# Patient Record
Sex: Male | Born: 1955 | Race: White | Hispanic: No | Marital: Married | Smoking: Former smoker
Health system: Southern US, Community
[De-identification: ages and names within clinical notes are randomized; demographics above are authoritative.]

## PROBLEM LIST (undated history)

## (undated) DIAGNOSIS — I10 Essential (primary) hypertension: Secondary | ICD-10-CM

## (undated) DIAGNOSIS — J45909 Unspecified asthma, uncomplicated: Secondary | ICD-10-CM

## (undated) DIAGNOSIS — K219 Gastro-esophageal reflux disease without esophagitis: Secondary | ICD-10-CM

## (undated) DIAGNOSIS — T7840XA Allergy, unspecified, initial encounter: Secondary | ICD-10-CM

## (undated) DIAGNOSIS — E119 Type 2 diabetes mellitus without complications: Secondary | ICD-10-CM

## (undated) DIAGNOSIS — E1165 Type 2 diabetes mellitus with hyperglycemia: Principal | ICD-10-CM

## (undated) DIAGNOSIS — D72829 Elevated white blood cell count, unspecified: Secondary | ICD-10-CM

## (undated) DIAGNOSIS — K921 Melena: Secondary | ICD-10-CM

## (undated) DIAGNOSIS — E039 Hypothyroidism, unspecified: Secondary | ICD-10-CM

## (undated) HISTORY — PX: TONSILLECTOMY: SUR1361

## (undated) HISTORY — DX: Allergy, unspecified, initial encounter: T78.40XA

## (undated) HISTORY — DX: Unspecified asthma, uncomplicated: J45.909

## (undated) HISTORY — PX: SPLENECTOMY: SUR1306

## (undated) HISTORY — DX: Gastro-esophageal reflux disease without esophagitis: K21.9

## (undated) HISTORY — DX: Type 2 diabetes mellitus without complications: E11.9

## (undated) HISTORY — PX: TRACHEOSTOMY: SUR1362

## (undated) HISTORY — DX: Essential (primary) hypertension: I10

---

## 2005-05-30 ENCOUNTER — Ambulatory Visit: Payer: Self-pay | Admitting: Physical Medicine & Rehabilitation

## 2005-05-30 ENCOUNTER — Inpatient Hospital Stay (HOSPITAL_COMMUNITY)
Admission: RE | Admit: 2005-05-30 | Discharge: 2005-06-09 | Payer: Self-pay | Admitting: Physical Medicine & Rehabilitation

## 2005-06-22 ENCOUNTER — Ambulatory Visit (HOSPITAL_COMMUNITY)
Admission: RE | Admit: 2005-06-22 | Discharge: 2005-06-22 | Payer: Self-pay | Admitting: Physical Medicine & Rehabilitation

## 2005-07-12 ENCOUNTER — Encounter: Payer: Self-pay | Admitting: Physical Medicine & Rehabilitation

## 2005-07-19 ENCOUNTER — Encounter
Admission: RE | Admit: 2005-07-19 | Discharge: 2005-10-17 | Payer: Self-pay | Admitting: Physical Medicine & Rehabilitation

## 2005-07-19 ENCOUNTER — Ambulatory Visit: Payer: Self-pay | Admitting: Physical Medicine & Rehabilitation

## 2005-07-20 ENCOUNTER — Encounter: Payer: Self-pay | Admitting: Physical Medicine & Rehabilitation

## 2005-08-19 ENCOUNTER — Encounter: Payer: Self-pay | Admitting: Physical Medicine & Rehabilitation

## 2005-09-19 ENCOUNTER — Encounter: Payer: Self-pay | Admitting: Physical Medicine & Rehabilitation

## 2005-10-12 ENCOUNTER — Encounter
Admission: RE | Admit: 2005-10-12 | Discharge: 2006-01-10 | Payer: Self-pay | Admitting: Physical Medicine & Rehabilitation

## 2005-10-12 ENCOUNTER — Ambulatory Visit: Payer: Self-pay | Admitting: Physical Medicine & Rehabilitation

## 2005-11-07 ENCOUNTER — Ambulatory Visit: Payer: Self-pay | Admitting: Physical Medicine & Rehabilitation

## 2005-11-21 ENCOUNTER — Ambulatory Visit: Payer: Self-pay | Admitting: Physical Medicine & Rehabilitation

## 2005-12-07 ENCOUNTER — Ambulatory Visit: Payer: Self-pay | Admitting: Physical Medicine & Rehabilitation

## 2006-01-04 ENCOUNTER — Ambulatory Visit: Payer: Self-pay | Admitting: Physical Medicine & Rehabilitation

## 2006-01-30 ENCOUNTER — Ambulatory Visit: Payer: Self-pay | Admitting: Physical Medicine & Rehabilitation

## 2006-01-30 ENCOUNTER — Encounter
Admission: RE | Admit: 2006-01-30 | Discharge: 2006-04-30 | Payer: Self-pay | Admitting: Physical Medicine & Rehabilitation

## 2006-03-26 ENCOUNTER — Ambulatory Visit: Payer: Self-pay | Admitting: Physical Medicine & Rehabilitation

## 2006-03-26 ENCOUNTER — Encounter
Admission: RE | Admit: 2006-03-26 | Discharge: 2006-06-24 | Payer: Self-pay | Admitting: Physical Medicine & Rehabilitation

## 2010-03-12 ENCOUNTER — Encounter: Payer: Self-pay | Admitting: Physical Medicine & Rehabilitation

## 2010-11-07 ENCOUNTER — Ambulatory Visit: Payer: Self-pay | Admitting: Pain Medicine

## 2011-03-01 ENCOUNTER — Ambulatory Visit: Payer: Self-pay | Admitting: Gastroenterology

## 2011-03-01 LAB — HM COLONOSCOPY

## 2011-03-02 LAB — PATHOLOGY REPORT

## 2011-06-14 ENCOUNTER — Ambulatory Visit: Payer: Self-pay | Admitting: Family Medicine

## 2011-06-20 ENCOUNTER — Ambulatory Visit: Payer: Self-pay | Admitting: Family Medicine

## 2011-07-21 ENCOUNTER — Ambulatory Visit: Payer: Self-pay | Admitting: Family Medicine

## 2011-08-20 ENCOUNTER — Ambulatory Visit: Payer: Self-pay | Admitting: Family Medicine

## 2012-03-24 ENCOUNTER — Ambulatory Visit: Payer: Self-pay

## 2012-04-21 ENCOUNTER — Ambulatory Visit: Payer: Self-pay

## 2013-05-15 IMAGING — NM NM THYROID IMAGING W/ UPTAKE SINGLE (24 HR)
1 series · 3 of 3 positions shown · non-contrast
Comparison: none

REASON FOR EXAM: hyperthyroidism
COMMENTS:

[Series 1000: (id) thyroid scan · 2.40mm/px · 3 of 3 slices shown]
[im 1/3  full-range]
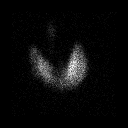
[im 2/3  full-range]
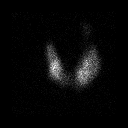
[im 3/3  full-range]
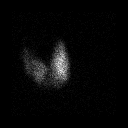

[3 of 3 positions shown; findings below may reference images not displayed]

PROCEDURE:     KNM - KNM THYROID Y-11Y 24HR [DATE]  [DATE]

RESULT:     The patient received 146.9 and 6 uCi of Y-11Y orally. Scanning
was then performed in the anterior and RAO and LAO projections.

The left thyroid lobe appears slightly larger than the right. There is more
uptake in the left thyroid lobe than on the right as well. No cold nodules
are demonstrated.

The six-hour uptake is abnormally increased at 53.4 present and the 24-hour
uptake is also increased at 56.6%.
IMPRESSION: The thyroid lobes demonstrates increased uptake bilaterally
with more uptake noted on the left than on the right. The uptake values are
consistent with hyperthyroidism.

[REDACTED]

## 2014-10-18 ENCOUNTER — Other Ambulatory Visit: Payer: Self-pay | Admitting: Family Medicine

## 2014-10-18 ENCOUNTER — Ambulatory Visit: Payer: Self-pay | Admitting: Family Medicine

## 2014-10-19 ENCOUNTER — Ambulatory Visit (INDEPENDENT_AMBULATORY_CARE_PROVIDER_SITE_OTHER): Payer: Medicare Other | Admitting: Family Medicine

## 2014-10-19 ENCOUNTER — Encounter: Payer: Self-pay | Admitting: Family Medicine

## 2014-10-19 VITALS — BP 118/70 | HR 73 | Temp 98.9°F | Resp 18 | Ht 72.0 in | Wt 254.7 lb

## 2014-10-19 DIAGNOSIS — E1169 Type 2 diabetes mellitus with other specified complication: Secondary | ICD-10-CM | POA: Insufficient documentation

## 2014-10-19 DIAGNOSIS — J45909 Unspecified asthma, uncomplicated: Secondary | ICD-10-CM | POA: Insufficient documentation

## 2014-10-19 DIAGNOSIS — K219 Gastro-esophageal reflux disease without esophagitis: Secondary | ICD-10-CM | POA: Insufficient documentation

## 2014-10-19 DIAGNOSIS — I10 Essential (primary) hypertension: Secondary | ICD-10-CM | POA: Diagnosis not present

## 2014-10-19 DIAGNOSIS — E118 Type 2 diabetes mellitus with unspecified complications: Secondary | ICD-10-CM

## 2014-10-19 DIAGNOSIS — E1165 Type 2 diabetes mellitus with hyperglycemia: Secondary | ICD-10-CM

## 2014-10-19 DIAGNOSIS — Z794 Long term (current) use of insulin: Secondary | ICD-10-CM

## 2014-10-19 DIAGNOSIS — E1129 Type 2 diabetes mellitus with other diabetic kidney complication: Secondary | ICD-10-CM | POA: Insufficient documentation

## 2014-10-19 DIAGNOSIS — R809 Proteinuria, unspecified: Secondary | ICD-10-CM

## 2014-10-19 DIAGNOSIS — E785 Hyperlipidemia, unspecified: Secondary | ICD-10-CM | POA: Diagnosis not present

## 2014-10-19 DIAGNOSIS — IMO0002 Reserved for concepts with insufficient information to code with codable children: Secondary | ICD-10-CM

## 2014-10-19 DIAGNOSIS — M549 Dorsalgia, unspecified: Secondary | ICD-10-CM | POA: Insufficient documentation

## 2014-10-19 DIAGNOSIS — R0989 Other specified symptoms and signs involving the circulatory and respiratory systems: Secondary | ICD-10-CM

## 2014-10-19 MED ORDER — PROPRANOLOL HCL 40 MG PO TABS: 40.0000 mg | ORAL_TABLET | Freq: Two times a day (BID) | ORAL | Status: DC

## 2014-10-19 MED ORDER — ATORVASTATIN CALCIUM 10 MG PO TABS: 10.0000 mg | ORAL_TABLET | Freq: Every day | ORAL | Status: DC

## 2014-10-19 NOTE — Progress Notes (Signed)
Name: Alan George   MRN: [id number]    DOB: 1956-01-26   Date:10/19/2014       Progress Note  Subjective  Chief Complaint  Chief Complaint  Patient presents with  . Medication Refill    Propranolol 40 mg / atorvastatin 10 mg  . Diabetes  . Hypertension  . Asthma  . Back Pain  . Gastrophageal Reflux    Diabetes He presents for his follow-up diabetic visit. He has type 2 diabetes mellitus. His disease course has been stable. Associated symptoms include fatigue and polyuria. Pertinent negatives for diabetes include no chest pain. Pertinent negatives for diabetic complications include no CVA or heart disease. Risk factors for coronary artery disease include dyslipidemia, male sex and obesity. Current diabetic treatment includes oral agent (dual therapy). He is following a generally healthy (admits to not eating a balanced diabetic diet.) diet. An ACE inhibitor/angiotensin II receptor blocker is not being taken. Eye exam is current.  Hypertension This is a chronic problem. The problem is controlled. Pertinent negatives include no chest pain, palpitations or shortness of breath. Past treatments include beta blockers. There is no history of CAD/MI or CVA.  Hyperlipidemia This is a chronic problem. The problem is controlled. Pertinent negatives include no chest pain, leg pain, myalgias or shortness of breath. Current antihyperlipidemic treatment includes statins. There are no compliance problems.     Past Medical History  Diagnosis Date  . Allergy   . Asthma   . Hypertension   . Diabetes mellitus without complication   . GERD (gastroesophageal reflux disease)     Past Surgical History  Procedure Laterality Date  . Splenectomy    . Tonsillectomy    . Tracheostomy      Family History  Problem Relation Age of Onset  . Bipolar disorder Mother   . Cancer Mother     BREAST  . Diabetes Father   . Bipolar disorder Brother   . Cancer Paternal Grandfather     LUNG    Social History    Social History  . Marital Status: Married    Spouse Name: N/A  . Number of Children: N/A  . Years of Education: N/A   Occupational History  . Not on file.   Social History Main Topics  . Smoking status: Former Games developer  . Smokeless tobacco: Never Used  . Alcohol Use: No  . Drug Use: No  . Sexual Activity: Not on file   Other Topics Concern  . Not on file   Social History Narrative  . No narrative on file     Current outpatient prescriptions:  .  albuterol (PROAIR HFA) 108 (90 BASE) MCG/ACT inhaler, Inhale into the lungs., Disp: , Rfl:  .  atorvastatin (LIPITOR) 10 MG tablet, , Disp: , Rfl: 0 .  cyclobenzaprine (FLEXERIL) 10 MG tablet, Take by mouth., Disp: , Rfl:  .  glimepiride (AMARYL) 2 MG tablet, , Disp: , Rfl: 1 .  HYDROmorphone (DILAUDID) 4 MG tablet, take 0.5 tablet by mouth every 4 hours if needed, Disp: , Rfl: 0 .  Melatonin 1 MG TABS, Take by mouth., Disp: , Rfl:  .  metFORMIN (GLUCOPHAGE-XR) 500 MG 24 hr tablet, , Disp: , Rfl: 1 .  mirtazapine (REMERON) 30 MG tablet, Take 30 mg by mouth at bedtime., Disp: , Rfl: 0 .  omeprazole (PRILOSEC) 20 MG capsule, Take by mouth., Disp: , Rfl:  .  propranolol (INDERAL) 40 MG tablet, TAKE ONE TABLET BY MOUTH TWICE DAILY, Disp: 180 tablet, Rfl:  0 .  sertraline (ZOLOFT) 100 MG tablet, Take 200 mg by mouth daily., Disp: , Rfl: 0 .  Vitamin D, Ergocalciferol, (DRISDOL) 50000 UNITS CAPS capsule, Take by mouth., Disp: , Rfl:   Allergies  Allergen Reactions  . Dog Epithelium   . Dust Mite Extract   . Pollen Extract   . Tree Extract   . Iodinated Diagnostic Agents Hives    ?gadolinium-thinks it was after MRI  . Other Rash     Review of Systems  Constitutional: Positive for fatigue.  Respiratory: Negative for shortness of breath.   Cardiovascular: Negative for chest pain and palpitations.  Musculoskeletal: Negative for myalgias.    Objective  Filed Vitals:   10/19/14 1036  BP: 118/70  Pulse: 73  Temp: 98.9 F  (37.2 C)  TempSrc: Oral  Resp: 18  Height: 6' (1.829 m)  Weight: 254 lb 11.2 oz (115.531 kg)  SpO2: 94%    Physical Exam  Constitutional: He is oriented to person, place, and time and well-developed, well-nourished, and in no distress.  HENT:  Head: Normocephalic and atraumatic.  Cardiovascular: Normal rate and regular rhythm.   Pulmonary/Chest: Effort normal and breath sounds normal.  Abdominal: Soft. Bowel sounds are normal.  Neurological: He is alert and oriented to person, place, and time.  Skin: Skin is warm and dry.  Psychiatric: Mood, memory, affect and judgment normal.  Nursing note and vitals reviewed.  Assessment & Plan  1. Essential hypertension  - propranolol (INDERAL) 40 MG tablet; Take 1 tablet (40 mg total) by mouth 2 (two) times daily.  Dispense: 180 tablet; Refill: 0  2. Diabetes mellitus type 2, uncontrolled, with complications  - HgB A1c - Urine Microalbumin w/creat. ratio  3. Absent peripheral pulse  Left posterior tibialis pulse was not palpated. Asian described decreased sensation in left foot compared to the right on monofilament testing. Referral to vascular surgery for evaluation of peripheral circulation.  - Ambulatory referral to Vascular Surgery  4. Dyslipidemia  - atorvastatin (LIPITOR) 10 MG tablet; Take 1 tablet (10 mg total) by mouth daily at 6 PM.  Dispense: 90 tablet; Refill: 0 - Lipid Profile - Basic Metabolic Panel (BMET) - Hepatic function panel   Brendaly Townsel [facility]ad [facility] 10/19/2014 10:58 AM

## 2014-10-20 LAB — HEPATIC FUNCTION PANEL
ALBUMIN: 4.7 g/dL (ref 3.5–5.5)
ALT: 45 IU/L — ABNORMAL HIGH (ref 0–44)
AST: 45 IU/L — ABNORMAL HIGH (ref 0–40)
Alkaline Phosphatase: 72 IU/L (ref 39–117)
BILIRUBIN TOTAL: 0.3 mg/dL (ref 0.0–1.2)
Bilirubin, Direct: 0.1 mg/dL (ref 0.00–0.40)
Total Protein: 8.2 g/dL (ref 6.0–8.5)

## 2014-10-20 LAB — LIPID PANEL
CHOL/HDL RATIO: 4.1 ratio (ref 0.0–5.0)
CHOLESTEROL TOTAL: 205 mg/dL — AB (ref 100–199)
HDL: 50 mg/dL (ref >39)
LDL CALC: 129 mg/dL — AB (ref 0–99)
Triglycerides: 128 mg/dL (ref 0–149)
VLDL Cholesterol Cal: 26 mg/dL (ref 5–40)

## 2014-10-20 LAB — MICROALBUMIN / CREATININE URINE RATIO
CREATININE, UR: 155.4 mg/dL
MICROALB/CREAT RATIO: 91.7 mg/g{creat} — AB (ref 0.0–30.0)
MICROALBUM., U, RANDOM: 142.5 ug/mL

## 2014-10-20 LAB — BASIC METABOLIC PANEL
BUN/Creatinine Ratio: 20 (ref 9–20)
BUN: 21 mg/dL (ref 6–24)
CALCIUM: 10.6 mg/dL — AB (ref 8.7–10.2)
CHLORIDE: 102 mmol/L (ref 97–108)
CO2: 21 mmol/L (ref 18–29)
Creatinine, Ser: 1.07 mg/dL (ref 0.76–1.27)
GFR calc Af Amer: 87 mL/min/{1.73_m2} (ref >59)
GFR calc non Af Amer: 76 mL/min/{1.73_m2} (ref >59)
Glucose: 250 mg/dL — ABNORMAL HIGH (ref 65–99)
POTASSIUM: 5.9 mmol/L — AB (ref 3.5–5.2)
Sodium: 144 mmol/L (ref 134–144)

## 2014-10-20 LAB — HEMOGLOBIN A1C
Est. average glucose Bld gHb Est-mCnc: 226 mg/dL
Hgb A1c MFr Bld: 9.5 % — ABNORMAL HIGH (ref 4.8–5.6)

## 2014-10-22 ENCOUNTER — Telehealth: Payer: Self-pay | Admitting: Family Medicine

## 2014-10-22 ENCOUNTER — Other Ambulatory Visit: Payer: Self-pay | Admitting: Family Medicine

## 2014-10-22 DIAGNOSIS — R809 Proteinuria, unspecified: Secondary | ICD-10-CM | POA: Insufficient documentation

## 2014-10-22 NOTE — Telephone Encounter (Signed)
Routed to Dr. Sherryll Burger to order Nephrology Referral

## 2014-10-22 NOTE — Telephone Encounter (Signed)
Referral to nephrology entered

## 2014-10-27 NOTE — Telephone Encounter (Signed)
Routed to [facility] for scheduling

## 2014-12-18 ENCOUNTER — Other Ambulatory Visit: Payer: Self-pay | Admitting: Family Medicine

## 2014-12-23 ENCOUNTER — Telehealth: Payer: Self-pay | Admitting: Family Medicine

## 2014-12-23 DIAGNOSIS — E785 Hyperlipidemia, unspecified: Secondary | ICD-10-CM

## 2014-12-23 MED ORDER — ATORVASTATIN CALCIUM 10 MG PO TABS: 10.0000 mg | ORAL_TABLET | Freq: Every day | ORAL | Status: DC

## 2014-12-23 NOTE — Telephone Encounter (Signed)
ERRENOUS °

## 2014-12-23 NOTE — Telephone Encounter (Signed)
PT IS NEEDING REFILL ON CHOLESTEROL MEDS AND ALSO SAID THAT THE DR CALLED HIM YESTERDAY OR THE DAY BEFORE AND DID NOT KNOW WHAT THAT WAS FOR.PT WIFE ASKING TO CALL HER AND SPEAK WITH ONE OF THEM ABOUT WHAT THE CALL WAS FOR. PHARM IS [facility] ON [address]

## 2014-12-23 NOTE — Telephone Encounter (Signed)
Medication has been refilled and sent to [facility] [address] 

## 2015-01-11 ENCOUNTER — Encounter: Payer: Self-pay | Admitting: Family Medicine

## 2015-01-19 ENCOUNTER — Ambulatory Visit (INDEPENDENT_AMBULATORY_CARE_PROVIDER_SITE_OTHER): Payer: Medicare Other | Admitting: Family Medicine

## 2015-01-19 ENCOUNTER — Encounter: Payer: Self-pay | Admitting: Family Medicine

## 2015-01-19 DIAGNOSIS — E118 Type 2 diabetes mellitus with unspecified complications: Secondary | ICD-10-CM

## 2015-01-19 DIAGNOSIS — E785 Hyperlipidemia, unspecified: Secondary | ICD-10-CM

## 2015-01-19 DIAGNOSIS — R748 Abnormal levels of other serum enzymes: Secondary | ICD-10-CM

## 2015-01-19 DIAGNOSIS — R945 Abnormal results of liver function studies: Secondary | ICD-10-CM | POA: Insufficient documentation

## 2015-01-19 DIAGNOSIS — R52 Pain, unspecified: Secondary | ICD-10-CM | POA: Insufficient documentation

## 2015-01-19 DIAGNOSIS — D72829 Elevated white blood cell count, unspecified: Secondary | ICD-10-CM

## 2015-01-19 DIAGNOSIS — E1165 Type 2 diabetes mellitus with hyperglycemia: Secondary | ICD-10-CM

## 2015-01-19 DIAGNOSIS — R7989 Other specified abnormal findings of blood chemistry: Secondary | ICD-10-CM | POA: Insufficient documentation

## 2015-01-19 DIAGNOSIS — IMO0002 Reserved for concepts with insufficient information to code with codable children: Secondary | ICD-10-CM

## 2015-01-19 LAB — POCT GLYCOSYLATED HEMOGLOBIN (HGB A1C): Hemoglobin A1C: 11.3

## 2015-01-19 MED ORDER — METFORMIN HCL 1000 MG PO TABS: 1000.0000 mg | ORAL_TABLET | Freq: Two times a day (BID) | ORAL | Status: DC

## 2015-01-19 MED ORDER — GLIPIZIDE 5 MG PO TABS: 5.0000 mg | ORAL_TABLET | Freq: Two times a day (BID) | ORAL | Status: DC

## 2015-01-19 MED ORDER — INSULIN GLARGINE 100 UNIT/ML SOLOSTAR PEN: 10.0000 [IU] | PEN_INJECTOR | Freq: Every day | SUBCUTANEOUS | Status: DC

## 2015-01-19 NOTE — Progress Notes (Signed)
Name: Alan QueenRobert Ditter   MRN: [id number]    DOB: 08/03/1955   Date:01/19/2015       Progress Note  Subjective  Chief Complaint  Chief Complaint  Patient presents with  . Follow-up    3 mo  . Diabetes  . Hyperlipidemia  . Hypertension  . Gastroesophageal Reflux  . Medication Refill    propranolol 40 mg / lipitor 10 mg    Diabetes He presents for his follow-up diabetic visit. He has type 2 diabetes mellitus. His disease course has been worsening (A1c 11.3%). Associated symptoms include polyuria. Pertinent negatives for diabetes include no polydipsia. Current diabetic treatment includes oral agent (dual therapy). He is following a generally unhealthy (did eat sweets over the holidays.) diet. His breakfast blood glucose is taken between 7-8 am. His breakfast blood glucose range is generally 180-200 mg/dl.  Hyperlipidemia This is a chronic problem. The problem is uncontrolled. Recent lipid tests were reviewed and are high. Exacerbating diseases include obesity. Pertinent negatives include no leg pain, myalgias or shortness of breath. Current antihyperlipidemic treatment includes statins.    Past Medical History  Diagnosis Date  . Allergy   . Asthma   . Hypertension   . Diabetes mellitus without complication (HCC)   . GERD (gastroesophageal reflux disease)     Past Surgical History  Procedure Laterality Date  . Splenectomy    . Tonsillectomy    . Tracheostomy      Family History  Problem Relation Age of Onset  . Bipolar disorder Mother   . Cancer Mother     BREAST  . Diabetes Father   . Bipolar disorder Brother   . Cancer Paternal Grandfather     LUNG    Social History   Social History  . Marital Status: Married    Spouse Name: N/A  . Number of Children: N/A  . Years of Education: N/A   Occupational History  . Not on file.   Social History Main Topics  . Smoking status: Former Games developermoker  . Smokeless tobacco: Never Used  . Alcohol Use: No  . Drug Use: No  . Sexual  Activity: Not on file   Other Topics Concern  . Not on file   Social History Narrative    Current outpatient prescriptions:  .  albuterol (PROAIR HFA) 108 (90 BASE) MCG/ACT inhaler, Inhale into the lungs., Disp: , Rfl:  .  atorvastatin (LIPITOR) 10 MG tablet, Take 1 tablet (10 mg total) by mouth daily at 6 PM., Disp: 90 tablet, Rfl: 0 .  cyclobenzaprine (FLEXERIL) 10 MG tablet, Take by mouth., Disp: , Rfl:  .  glimepiride (AMARYL) 2 MG tablet, , Disp: , Rfl: 1 .  HYDROmorphone (DILAUDID) 4 MG tablet, take 0.5 tablet by mouth every 4 hours if needed, Disp: , Rfl: 0 .  Melatonin 1 MG TABS, Take by mouth., Disp: , Rfl:  .  metFORMIN (GLUCOPHAGE-XR) 500 MG 24 hr tablet, , Disp: , Rfl: 1 .  mirtazapine (REMERON) 30 MG tablet, Take 30 mg by mouth at bedtime., Disp: , Rfl: 0 .  omeprazole (PRILOSEC) 20 MG capsule, Take by mouth., Disp: , Rfl:  .  propranolol (INDERAL) 40 MG tablet, Take 1 tablet (40 mg total) by mouth 2 (two) times daily., Disp: 180 tablet, Rfl: 0 .  sertraline (ZOLOFT) 100 MG tablet, Take 200 mg by mouth daily., Disp: , Rfl: 0 .  Vitamin D, Ergocalciferol, (DRISDOL) 50000 UNITS CAPS capsule, Take by mouth., Disp: , Rfl:   Allergies  Allergen Reactions  . Dog Epithelium   . Dust Mite Extract   . Pollen Extract   . Tree Extract   . Iodinated Diagnostic Agents Hives    ?gadolinium-thinks it was after MRI  . Other Rash    Review of Systems  Respiratory: Negative for shortness of breath.   Musculoskeletal: Negative for myalgias.  Endo/Heme/Allergies: Negative for polydipsia.    Objective  Filed Vitals:   01/19/15 0927  BP: 118/80  Pulse: 80  Temp: 98.9 F (37.2 C)  TempSrc: Oral  Resp: 18  Height: 6' (1.829 m)  Weight: 250 lb 4.8 oz (113.535 kg)  SpO2: 94%    Physical Exam  Constitutional: He is oriented to person, place, and time and well-developed, well-nourished, and in no distress.  Cardiovascular: Normal rate, regular rhythm and normal heart sounds.    Pulmonary/Chest: Effort normal and breath sounds normal. He has no wheezes.  Abdominal: Soft. Bowel sounds are normal.  Neurological: He is alert and oriented to person, place, and time.  Nursing note and vitals reviewed.   Assessment & Plan  1. Uncontrolled type 2 diabetes mellitus with complication, without long-term current use of insulin (HCC) Poorly controlled and worsening diabetes mellitus. A1c increased from 9.5% to 11.3% last 3 months. Patient is not likely adherent to a strict diabetes diet. We will DC metformin XR  and glimepiride and start on metformin 1000 mg twice a day and glipizide 5 mg twice a day, add basal insulin (Lantus 10 units daily). Patient will be referred to [facility] lifestyle Center for pain management of diabetes. Asked to check his blood glucose 3 times daily. May decrease glipizide to half tablet twice a day if his blood sugar is persistently dropping and/or if he has symptoms of hypoglycemia. Recheck in one month. - POCT HgB A1C - POCT Glucose (CBG) - metFORMIN (GLUCOPHAGE) 1000 MG tablet; Take 1 tablet (1,000 mg total) by mouth 2 (two) times daily with a meal.  Dispense: 180 tablet; Refill: 3 - glipiZIDE (GLUCOTROL) 5 MG tablet; Take 1 tablet (5 mg total) by mouth 2 (two) times daily before a meal.  Dispense: 60 tablet; Refill: 3 - Insulin Glargine (LANTUS SOLOSTAR) 100 UNIT/ML Solostar Pen; Inject 10 Units into the skin daily at 10 pm.  Dispense: 15 mL; Refill: 11 - Amb ref to Medical Nutrition Therapy-MNT  2. Leukocytosis  - CBC with Differential  3. Elevated liver enzymes Repeat liver enzymes and if normal, will start on statin therapy. - Comprehensive Metabolic Panel (CMET)  4. Dyslipidemia Recheck FLP and if liver enzymes normal, we'll start on statin therapy - Lipid Profile    Kacey Vicuna [facility] 01/19/2015 9:48 AM

## 2015-03-10 ENCOUNTER — Other Ambulatory Visit: Payer: Self-pay | Admitting: Family Medicine

## 2015-03-16 NOTE — Telephone Encounter (Signed)
Medication has been refilled and sent to [facility] [address] 

## 2015-04-19 ENCOUNTER — Encounter: Payer: Self-pay | Admitting: Family Medicine

## 2015-04-19 ENCOUNTER — Ambulatory Visit (INDEPENDENT_AMBULATORY_CARE_PROVIDER_SITE_OTHER): Payer: Medicare Other | Admitting: Family Medicine

## 2015-04-19 VITALS — BP 124/78 | HR 84 | Temp 98.7°F | Resp 18 | Ht 72.0 in | Wt 250.1 lb

## 2015-04-19 DIAGNOSIS — E785 Hyperlipidemia, unspecified: Secondary | ICD-10-CM | POA: Diagnosis not present

## 2015-04-19 DIAGNOSIS — E1129 Type 2 diabetes mellitus with other diabetic kidney complication: Secondary | ICD-10-CM | POA: Diagnosis not present

## 2015-04-19 DIAGNOSIS — K219 Gastro-esophageal reflux disease without esophagitis: Secondary | ICD-10-CM | POA: Diagnosis not present

## 2015-04-19 DIAGNOSIS — R809 Proteinuria, unspecified: Secondary | ICD-10-CM | POA: Diagnosis not present

## 2015-04-19 DIAGNOSIS — Z794 Long term (current) use of insulin: Secondary | ICD-10-CM

## 2015-04-19 DIAGNOSIS — R748 Abnormal levels of other serum enzymes: Secondary | ICD-10-CM

## 2015-04-19 DIAGNOSIS — I10 Essential (primary) hypertension: Secondary | ICD-10-CM | POA: Diagnosis not present

## 2015-04-19 LAB — GLUCOSE, POCT (MANUAL RESULT ENTRY)
POC GLUCOSE: 200 mg/dL — AB (ref 70–99)
POC Glucose: 200 mg/dl — AB (ref 70–99)

## 2015-04-19 LAB — POCT GLYCOSYLATED HEMOGLOBIN (HGB A1C): HEMOGLOBIN A1C: 9.7

## 2015-04-19 MED ORDER — LISINOPRIL 2.5 MG PO TABS: 2.5000 mg | ORAL_TABLET | Freq: Every day | ORAL | Status: DC

## 2015-04-19 MED ORDER — OMEPRAZOLE 20 MG PO CPDR: 20.0000 mg | DELAYED_RELEASE_CAPSULE | Freq: Every day | ORAL | Status: DC

## 2015-04-19 MED ORDER — PROPRANOLOL HCL 40 MG PO TABS: 40.0000 mg | ORAL_TABLET | Freq: Two times a day (BID) | ORAL | Status: DC

## 2015-04-19 MED ORDER — INSULIN GLARGINE 100 UNIT/ML SOLOSTAR PEN: 20.0000 [IU] | PEN_INJECTOR | Freq: Every day | SUBCUTANEOUS | Status: DC

## 2015-04-19 NOTE — Progress Notes (Signed)
Name: Alan George   MRN: [id number]    DOB: 31-Dec-1955   Date:04/19/2015       Progress Note  Subjective  Chief Complaint  Chief Complaint  Patient presents with  . Diabetes    pt here for 3 month follow up  . Hypertension  . Hyperlipidemia  . Gastroesophageal Reflux    Diabetes He presents for his follow-up diabetic visit. He has type 2 diabetes mellitus. His disease course has been improving. There are no hypoglycemic associated symptoms. Pertinent negatives for hypoglycemia include no headaches. Pertinent negatives for diabetes include no chest pain, no fatigue, no polydipsia and no polyuria. Pertinent negatives for diabetic complications include no CVA. He is currently taking insulin at bedtime. Insulin injections are given by patient. Rotation sites for injection include the abdominal wall. His home blood glucose trend is decreasing steadily. His breakfast blood glucose range is generally >200 mg/dl. An ACE inhibitor/angiotensin II receptor blocker is not being taken.  Hypertension This is a chronic problem. The problem is controlled. Pertinent negatives include no chest pain, headaches, palpitations or shortness of breath. Past treatments include beta blockers. There is no history of kidney disease, CAD/MI or CVA.  Hyperlipidemia This is a chronic problem. The problem is uncontrolled. Recent lipid tests were reviewed and are high. Pertinent negatives include no chest pain, leg pain, myalgias or shortness of breath. Current antihyperlipidemic treatment includes statins.  Gastroesophageal Reflux He reports no abdominal pain, no belching, no chest pain, no coughing, no dysphagia or no heartburn. symptoms controlled on medication.. This is a chronic problem. The problem has been unchanged. Pertinent negatives include no fatigue. He has tried a PPI for the symptoms.     Past Medical History  Diagnosis Date  . Allergy   . Asthma   . Hypertension   . Diabetes mellitus without  complication (HCC)   . GERD (gastroesophageal reflux disease)     Past Surgical History  Procedure Laterality Date  . Splenectomy    . Tonsillectomy    . Tracheostomy      Family History  Problem Relation Age of Onset  . Bipolar disorder Mother   . Cancer Mother     BREAST  . Diabetes Father   . Bipolar disorder Brother   . Cancer Paternal Grandfather     LUNG    Social History   Social History  . Marital Status: Married    Spouse Name: N/A  . Number of Children: N/A  . Years of Education: N/A   Occupational History  . Not on file.   Social History Main Topics  . Smoking status: Former Games developer  . Smokeless tobacco: Never Used  . Alcohol Use: No  . Drug Use: No  . Sexual Activity: Not on file   Other Topics Concern  . Not on file   Social History Narrative     Current outpatient prescriptions:  .  albuterol (PROAIR HFA) 108 (90 BASE) MCG/ACT inhaler, Inhale into the lungs., Disp: , Rfl:  .  atorvastatin (LIPITOR) 10 MG tablet, Take 1 tablet (10 mg total) by mouth daily at 6 PM., Disp: 90 tablet, Rfl: 0 .  cyclobenzaprine (FLEXERIL) 10 MG tablet, Take by mouth., Disp: , Rfl:  .  glipiZIDE (GLUCOTROL) 5 MG tablet, Take 1 tablet (5 mg total) by mouth 2 (two) times daily before a meal., Disp: 60 tablet, Rfl: 3 .  HYDROmorphone (DILAUDID) 4 MG tablet, take 0.5 tablet by mouth every 4 hours if needed, Disp: , Rfl: 0 .  Insulin Glargine (LANTUS SOLOSTAR) 100 UNIT/ML Solostar Pen, Inject 10 Units into the skin daily at 10 pm., Disp: 15 mL, Rfl: 11 .  Melatonin 1 MG TABS, Take by mouth., Disp: , Rfl:  .  metFORMIN (GLUCOPHAGE) 1000 MG tablet, Take 1 tablet (1,000 mg total) by mouth 2 (two) times daily with a meal., Disp: 180 tablet, Rfl: 3 .  mirtazapine (REMERON) 30 MG tablet, Take 30 mg by mouth at bedtime., Disp: , Rfl: 0 .  omeprazole (PRILOSEC) 20 MG capsule, Take by mouth., Disp: , Rfl:  .  propranolol (INDERAL) 40 MG tablet, Take 1 tablet (40 mg total) by mouth 2  (two) times daily., Disp: 180 tablet, Rfl: 0 .  sertraline (ZOLOFT) 100 MG tablet, Take 200 mg by mouth daily., Disp: , Rfl: 0 .  Vitamin D, Ergocalciferol, (DRISDOL) 50000 UNITS CAPS capsule, Take by mouth., Disp: , Rfl:   Allergies  Allergen Reactions  . Dog Epithelium   . Dust Mite Extract   . Pollen Extract   . Tree Extract   . Iodinated Diagnostic Agents Hives    ?gadolinium-thinks it was after MRI  . Other Rash     Review of Systems  Constitutional: Negative for fatigue.  Respiratory: Negative for cough and shortness of breath.   Cardiovascular: Negative for chest pain and palpitations.  Gastrointestinal: Negative for heartburn, dysphagia and abdominal pain.  Musculoskeletal: Negative for myalgias.  Neurological: Negative for headaches.  Endo/Heme/Allergies: Negative for polydipsia.    Objective  Filed Vitals:   04/19/15 0936  BP: 124/78  Pulse: 84  Temp: 98.7 F (37.1 C)  Resp: 18  Height: 6' (1.829 m)  Weight: 250 lb 2 oz (113.456 kg)  SpO2: 96%    Physical Exam  Constitutional: He is oriented to person, place, and time and well-developed, well-nourished, and in no distress.  HENT:  Head: Normocephalic and atraumatic.  Cardiovascular: Normal rate and regular rhythm.   Pulmonary/Chest: Effort normal and breath sounds normal.  Abdominal: Soft. Bowel sounds are normal.  Neurological: He is alert and oriented to person, place, and time.  Nursing note and vitals reviewed.      Assessment & Plan  1. Gastroesophageal reflux disease, esophagitis presence not specified  - omeprazole (PRILOSEC) 20 MG capsule; Take 1 capsule (20 mg total) by mouth daily.  Dispense: 90 capsule; Refill: 0  2. Essential hypertension  BP stable and at goal on present therapy. - propranolol (INDERAL) 40 MG tablet; Take 1 tablet (40 mg total) by mouth 2 (two) times daily.  Dispense: 180 tablet; Refill: 0  3. Dyslipidemia  - Lipid Profile  4. Elevated liver enzymes  likely  secondary to fatty liver disease, we will repeat today. - Comprehensive Metabolic Panel (CMET)  5. Type 2 diabetes mellitus with microalbuminuria, with long-term current use of insulin (HCC)  we'll start on low-dose ACEI-I, , increased Lantus to 20 units at bedtime ( to be titrated by 1 unit every night) , advised to continue with dietary approach for treatment of diabetes. - lisinopril (PRINIVIL,ZESTRIL) 2.5 MG tablet; Take 1 tablet (2.5 mg total) by mouth daily.  Dispense: 90 tablet; Refill: 0 - Insulin Glargine (LANTUS SOLOSTAR) 100 UNIT/ML Solostar Pen; Inject 20 Units into the skin daily at 10 pm.  Dispense: 15 mL; Refill: 11 - POCT HgB A1C - POCT Glucose (CBG)   Dwaine Pringle Asad [facility] 04/19/2015 9:49 AM

## 2015-04-20 LAB — COMPREHENSIVE METABOLIC PANEL
A/G RATIO: 1.4 (ref 1.1–2.5)
ALBUMIN: 4.6 g/dL (ref 3.5–5.5)
ALK PHOS: 74 IU/L (ref 39–117)
ALT: 38 IU/L (ref 0–44)
AST: 30 IU/L (ref 0–40)
BILIRUBIN TOTAL: 0.3 mg/dL (ref 0.0–1.2)
BUN / CREAT RATIO: 15 (ref 9–20)
BUN: 17 mg/dL (ref 6–24)
CHLORIDE: 103 mmol/L (ref 96–106)
CO2: 22 mmol/L (ref 18–29)
Calcium: 10.2 mg/dL (ref 8.7–10.2)
Creatinine, Ser: 1.16 mg/dL (ref 0.76–1.27)
GFR calc Af Amer: 79 mL/min/{1.73_m2} (ref >59)
GFR calc non Af Amer: 69 mL/min/{1.73_m2} (ref >59)
GLOBULIN, TOTAL: 3.4 g/dL (ref 1.5–4.5)
Glucose: 192 mg/dL — ABNORMAL HIGH (ref 65–99)
POTASSIUM: 5.7 mmol/L — AB (ref 3.5–5.2)
SODIUM: 140 mmol/L (ref 134–144)
Total Protein: 8 g/dL (ref 6.0–8.5)

## 2015-04-20 LAB — LIPID PANEL
CHOLESTEROL TOTAL: 160 mg/dL (ref 100–199)
Chol/HDL Ratio: 3.1 ratio units (ref 0.0–5.0)
HDL: 51 mg/dL (ref >39)
LDL CALC: 84 mg/dL (ref 0–99)
TRIGLYCERIDES: 127 mg/dL (ref 0–149)
VLDL Cholesterol Cal: 25 mg/dL (ref 5–40)

## 2015-06-15 ENCOUNTER — Other Ambulatory Visit: Payer: Self-pay | Admitting: Family Medicine

## 2015-07-07 DIAGNOSIS — F329 Major depressive disorder, single episode, unspecified: Secondary | ICD-10-CM | POA: Diagnosis not present

## 2015-07-14 ENCOUNTER — Ambulatory Visit (INDEPENDENT_AMBULATORY_CARE_PROVIDER_SITE_OTHER): Payer: Medicare Other | Admitting: Family Medicine

## 2015-07-14 ENCOUNTER — Encounter: Payer: Self-pay | Admitting: Family Medicine

## 2015-07-14 VITALS — BP 114/80 | HR 86 | Temp 98.0°F | Resp 16 | Ht 72.0 in | Wt 250.9 lb

## 2015-07-14 DIAGNOSIS — R809 Proteinuria, unspecified: Secondary | ICD-10-CM | POA: Diagnosis not present

## 2015-07-14 DIAGNOSIS — I10 Essential (primary) hypertension: Secondary | ICD-10-CM

## 2015-07-14 DIAGNOSIS — Z794 Long term (current) use of insulin: Secondary | ICD-10-CM | POA: Diagnosis not present

## 2015-07-14 DIAGNOSIS — E785 Hyperlipidemia, unspecified: Secondary | ICD-10-CM

## 2015-07-14 DIAGNOSIS — E1129 Type 2 diabetes mellitus with other diabetic kidney complication: Secondary | ICD-10-CM | POA: Diagnosis not present

## 2015-07-14 MED ORDER — PROPRANOLOL HCL 40 MG PO TABS: 40.0000 mg | ORAL_TABLET | Freq: Two times a day (BID) | ORAL | Status: DC

## 2015-07-14 MED ORDER — GLIPIZIDE 5 MG PO TABS: 5.0000 mg | ORAL_TABLET | Freq: Two times a day (BID) | ORAL | Status: DC

## 2015-07-14 NOTE — Progress Notes (Signed)
Name: Alan George   MRN: [id number]    DOB: January 16, 1956   Date:07/14/2015       Progress Note  Subjective  Chief Complaint  Chief Complaint  Patient presents with  . Hypertension    3 month follow up  . Diabetes  . Hyperlipidemia    Hypertension This is a chronic problem. The problem is controlled. Pertinent negatives include no blurred vision, chest pain, headaches, malaise/fatigue, palpitations or shortness of breath. Past treatments include beta blockers and ACE inhibitors. There is no history of kidney disease, CAD/MI or CVA.  Diabetes He presents for his follow-up diabetic visit. He has type 2 diabetes mellitus. His disease course has been improving. Pertinent negatives for hypoglycemia include no headaches. Pertinent negatives for diabetes include no blurred vision, no chest pain, no polydipsia and no polyuria. Pertinent negatives for diabetic complications include no CVA. Current diabetic treatment includes intensive insulin program and oral agent (dual therapy). He is following a diabetic diet. An ACE inhibitor/angiotensin II receptor blocker is being taken. Eye exam is current.  Hyperlipidemia This is a chronic problem. The problem is controlled. Recent lipid tests were reviewed and are normal. Pertinent negatives include no chest pain, leg pain, myalgias or shortness of breath. Current antihyperlipidemic treatment includes statins.    Past Medical History  Diagnosis Date  . Allergy   . Asthma   . Hypertension   . Diabetes mellitus without complication (HCC)   . GERD (gastroesophageal reflux disease)     Past Surgical History  Procedure Laterality Date  . Splenectomy    . Tonsillectomy    . Tracheostomy      Family History  Problem Relation Age of Onset  . Bipolar disorder Mother   . Cancer Mother     BREAST  . Diabetes Father   . Bipolar disorder Brother   . Cancer Paternal Grandfather     LUNG    Social History   Social History  . Marital Status: Married     Spouse Name: N/A  . Number of Children: N/A  . Years of Education: N/A   Occupational History  . Not on file.   Social History Main Topics  . Smoking status: Former Games developer  . Smokeless tobacco: Never Used  . Alcohol Use: No  . Drug Use: No  . Sexual Activity: Not on file   Other Topics Concern  . Not on file   Social History Narrative     Current outpatient prescriptions:  .  albuterol (PROAIR HFA) 108 (90 BASE) MCG/ACT inhaler, Inhale into the lungs., Disp: , Rfl:  .  atorvastatin (LIPITOR) 10 MG tablet, TAKE ONE TABLET BY MOUTH ONCE DAILY AT 6PM, Disp: 90 tablet, Rfl: 0 .  cyclobenzaprine (FLEXERIL) 10 MG tablet, Take by mouth., Disp: , Rfl:  .  glipiZIDE (GLUCOTROL) 5 MG tablet, Take 1 tablet (5 mg total) by mouth 2 (two) times daily before a meal., Disp: 60 tablet, Rfl: 3 .  HYDROmorphone (DILAUDID) 4 MG tablet, take 0.5 tablet by mouth every 4 hours if needed, Disp: , Rfl: 0 .  Insulin Glargine (LANTUS SOLOSTAR) 100 UNIT/ML Solostar Pen, Inject 20 Units into the skin daily at 10 pm., Disp: 15 mL, Rfl: 11 .  lisinopril (PRINIVIL,ZESTRIL) 2.5 MG tablet, Take 1 tablet (2.5 mg total) by mouth daily., Disp: 90 tablet, Rfl: 0 .  Melatonin 1 MG TABS, Take by mouth., Disp: , Rfl:  .  metFORMIN (GLUCOPHAGE) 1000 MG tablet, Take 1 tablet (1,000 mg total) by mouth  2 (two) times daily with a meal., Disp: 180 tablet, Rfl: 3 .  mirtazapine (REMERON) 30 MG tablet, Take 30 mg by mouth at bedtime., Disp: , Rfl: 0 .  omeprazole (PRILOSEC) 20 MG capsule, Take 1 capsule (20 mg total) by mouth daily., Disp: 90 capsule, Rfl: 0 .  propranolol (INDERAL) 40 MG tablet, Take 1 tablet (40 mg total) by mouth 2 (two) times daily., Disp: 180 tablet, Rfl: 0 .  sertraline (ZOLOFT) 100 MG tablet, Take 200 mg by mouth daily., Disp: , Rfl: 0 .  Vitamin D, Ergocalciferol, (DRISDOL) 50000 UNITS CAPS capsule, Take by mouth., Disp: , Rfl:   Allergies  Allergen Reactions  . Dog Epithelium   . Dust Mite  Extract   . Pollen Extract   . Tree Extract   . Iodinated Diagnostic Agents Hives    ?gadolinium-thinks it was after MRI  . Other Rash     Review of Systems  Constitutional: Negative for malaise/fatigue.  Eyes: Negative for blurred vision and double vision.  Respiratory: Negative for shortness of breath.   Cardiovascular: Negative for chest pain and palpitations.  Gastrointestinal: Negative for abdominal pain.  Musculoskeletal: Negative for myalgias.  Neurological: Negative for headaches.  Endo/Heme/Allergies: Negative for polydipsia.    Objective  Filed Vitals:   07/14/15 0834  BP: 114/80  Pulse: 86  Temp: 98 F (36.7 C)  TempSrc: Oral  Resp: 16  Height: 6' (1.829 m)  Weight: 250 lb 14.4 oz (113.807 kg)  SpO2: 94%    Physical Exam  Constitutional: He is oriented to person, place, and time and well-developed, well-nourished, and in no distress.  HENT:  Head: Normocephalic and atraumatic.  Cardiovascular: Normal rate and regular rhythm.   Pulmonary/Chest: Effort normal and breath sounds normal.  Abdominal: Soft. Bowel sounds are normal.  Neurological: He is alert and oriented to person, place, and time.  Psychiatric: Mood, memory, affect and judgment normal.  Nursing note and vitals reviewed.   Assessment & Plan  1. Essential hypertension  - propranolol (INDERAL) 40 MG tablet; Take 1 tablet (40 mg total) by mouth 2 (two) times daily.  Dispense: 180 tablet; Refill: 0  2. Type 2 diabetes mellitus with microalbuminuria, with long-term current use of insulin (HCC) He will return in one week to obtain an A1c and fasting glucose - glipiZIDE (GLUCOTROL) 5 MG tablet; Take 1 tablet (5 mg total) by mouth 2 (two) times daily before a meal.  Dispense: 180 tablet; Refill: 1 - POCT HgB A1C - POCT Glucose (CBG)  3. Dyslipidemia FLP at goal, labs from March 2017 reviewed with patient   Hazel SamsSyed Asad A. Faylene KurtzShah [facility] 07/14/2015 8:48 AM

## 2015-07-18 ENCOUNTER — Other Ambulatory Visit: Payer: Self-pay | Admitting: Family Medicine

## 2015-09-15 ENCOUNTER — Other Ambulatory Visit: Payer: Self-pay | Admitting: Family Medicine

## 2015-09-30 DIAGNOSIS — G8929 Other chronic pain: Secondary | ICD-10-CM | POA: Diagnosis not present

## 2015-09-30 DIAGNOSIS — M25561 Pain in right knee: Secondary | ICD-10-CM | POA: Diagnosis not present

## 2015-09-30 DIAGNOSIS — M25562 Pain in left knee: Secondary | ICD-10-CM | POA: Diagnosis not present

## 2015-09-30 DIAGNOSIS — M1990 Unspecified osteoarthritis, unspecified site: Secondary | ICD-10-CM | POA: Diagnosis not present

## 2015-09-30 DIAGNOSIS — M5442 Lumbago with sciatica, left side: Secondary | ICD-10-CM | POA: Diagnosis not present

## 2015-09-30 DIAGNOSIS — Z79891 Long term (current) use of opiate analgesic: Secondary | ICD-10-CM | POA: Diagnosis not present

## 2015-09-30 DIAGNOSIS — Z5181 Encounter for therapeutic drug level monitoring: Secondary | ICD-10-CM | POA: Diagnosis not present

## 2015-09-30 DIAGNOSIS — M25552 Pain in left hip: Secondary | ICD-10-CM | POA: Diagnosis not present

## 2015-10-11 ENCOUNTER — Other Ambulatory Visit: Payer: Self-pay | Admitting: Family Medicine

## 2015-10-11 DIAGNOSIS — I10 Essential (primary) hypertension: Secondary | ICD-10-CM

## 2015-10-14 ENCOUNTER — Ambulatory Visit: Payer: Medicare Other | Admitting: Family Medicine

## 2015-10-26 ENCOUNTER — Ambulatory Visit: Payer: Medicare Other | Admitting: Family Medicine

## 2015-12-02 DIAGNOSIS — M47817 Spondylosis without myelopathy or radiculopathy, lumbosacral region: Secondary | ICD-10-CM | POA: Diagnosis not present

## 2015-12-06 ENCOUNTER — Ambulatory Visit: Payer: Medicare Other | Admitting: Family Medicine

## 2015-12-09 ENCOUNTER — Other Ambulatory Visit: Payer: Self-pay | Admitting: Family Medicine

## 2015-12-09 DIAGNOSIS — IMO0002 Reserved for concepts with insufficient information to code with codable children: Secondary | ICD-10-CM

## 2015-12-09 DIAGNOSIS — E118 Type 2 diabetes mellitus with unspecified complications: Principal | ICD-10-CM

## 2015-12-09 DIAGNOSIS — E1165 Type 2 diabetes mellitus with hyperglycemia: Secondary | ICD-10-CM

## 2015-12-28 DIAGNOSIS — F329 Major depressive disorder, single episode, unspecified: Secondary | ICD-10-CM | POA: Diagnosis not present

## 2015-12-30 DIAGNOSIS — M170 Bilateral primary osteoarthritis of knee: Secondary | ICD-10-CM | POA: Diagnosis not present

## 2015-12-30 DIAGNOSIS — M25561 Pain in right knee: Secondary | ICD-10-CM | POA: Diagnosis not present

## 2015-12-30 DIAGNOSIS — G8929 Other chronic pain: Secondary | ICD-10-CM | POA: Diagnosis not present

## 2015-12-30 DIAGNOSIS — M47816 Spondylosis without myelopathy or radiculopathy, lumbar region: Secondary | ICD-10-CM | POA: Diagnosis not present

## 2015-12-30 DIAGNOSIS — Z79891 Long term (current) use of opiate analgesic: Secondary | ICD-10-CM | POA: Diagnosis not present

## 2015-12-30 DIAGNOSIS — M25562 Pain in left knee: Secondary | ICD-10-CM | POA: Diagnosis not present

## 2015-12-30 DIAGNOSIS — M25552 Pain in left hip: Secondary | ICD-10-CM | POA: Diagnosis not present

## 2015-12-30 DIAGNOSIS — M5442 Lumbago with sciatica, left side: Secondary | ICD-10-CM | POA: Diagnosis not present

## 2015-12-30 DIAGNOSIS — M1990 Unspecified osteoarthritis, unspecified site: Secondary | ICD-10-CM | POA: Diagnosis not present

## 2015-12-30 DIAGNOSIS — Z5181 Encounter for therapeutic drug level monitoring: Secondary | ICD-10-CM | POA: Diagnosis not present

## 2016-01-03 ENCOUNTER — Ambulatory Visit (INDEPENDENT_AMBULATORY_CARE_PROVIDER_SITE_OTHER): Payer: Medicare Other | Admitting: Family Medicine

## 2016-01-03 ENCOUNTER — Encounter: Payer: Self-pay | Admitting: Family Medicine

## 2016-01-03 VITALS — BP 138/72 | HR 74 | Temp 98.7°F | Resp 18 | Ht 72.0 in | Wt 255.9 lb

## 2016-01-03 DIAGNOSIS — E1121 Type 2 diabetes mellitus with diabetic nephropathy: Secondary | ICD-10-CM | POA: Diagnosis not present

## 2016-01-03 DIAGNOSIS — Z794 Long term (current) use of insulin: Secondary | ICD-10-CM | POA: Diagnosis not present

## 2016-01-03 DIAGNOSIS — I10 Essential (primary) hypertension: Secondary | ICD-10-CM

## 2016-01-03 DIAGNOSIS — E1165 Type 2 diabetes mellitus with hyperglycemia: Secondary | ICD-10-CM | POA: Diagnosis not present

## 2016-01-03 DIAGNOSIS — E785 Hyperlipidemia, unspecified: Secondary | ICD-10-CM | POA: Diagnosis not present

## 2016-01-03 DIAGNOSIS — K219 Gastro-esophageal reflux disease without esophagitis: Secondary | ICD-10-CM | POA: Diagnosis not present

## 2016-01-03 DIAGNOSIS — Z23 Encounter for immunization: Secondary | ICD-10-CM

## 2016-01-03 DIAGNOSIS — IMO0002 Reserved for concepts with insufficient information to code with codable children: Secondary | ICD-10-CM

## 2016-01-03 LAB — LIPID PANEL
Cholesterol: 234 mg/dL — ABNORMAL HIGH (ref <200)
HDL: 43 mg/dL (ref >40)
LDL CALC: 135 mg/dL — AB (ref <100)
Total CHOL/HDL Ratio: 5.4 Ratio — ABNORMAL HIGH (ref <5.0)
Triglycerides: 279 mg/dL — ABNORMAL HIGH (ref <150)
VLDL: 56 mg/dL — AB (ref <30)

## 2016-01-03 LAB — COMPLETE METABOLIC PANEL WITH GFR
ALT: 50 U/L — AB (ref 9–46)
AST: 37 U/L — AB (ref 10–35)
Albumin: 4.3 g/dL (ref 3.6–5.1)
Alkaline Phosphatase: 56 U/L (ref 40–115)
BUN: 26 mg/dL — AB (ref 7–25)
CHLORIDE: 102 mmol/L (ref 98–110)
CO2: 21 mmol/L (ref 20–31)
CREATININE: 1.09 mg/dL (ref 0.70–1.25)
Calcium: 10 mg/dL (ref 8.6–10.3)
GFR, Est African American: 85 mL/min (ref >=60)
GFR, Est Non African American: 73 mL/min (ref >=60)
GLUCOSE: 173 mg/dL — AB (ref 65–99)
Potassium: 4.7 mmol/L (ref 3.5–5.3)
Sodium: 134 mmol/L — ABNORMAL LOW (ref 135–146)
TOTAL PROTEIN: 7.5 g/dL (ref 6.1–8.1)
Total Bilirubin: 0.4 mg/dL (ref 0.2–1.2)

## 2016-01-03 LAB — POCT UA - MICROALBUMIN: MICROALBUMIN (UR) POC: 100 mg/L

## 2016-01-03 LAB — POCT GLYCOSYLATED HEMOGLOBIN (HGB A1C): HEMOGLOBIN A1C: 12.3

## 2016-01-03 MED ORDER — METFORMIN HCL 1000 MG PO TABS: 1000.0000 mg | ORAL_TABLET | Freq: Two times a day (BID) | ORAL | 3 refills | Status: DC

## 2016-01-03 MED ORDER — INSULIN GLARGINE 100 UNIT/ML SOLOSTAR PEN: 25.0000 [IU] | PEN_INJECTOR | Freq: Every day | SUBCUTANEOUS | 11 refills | Status: DC

## 2016-01-03 MED ORDER — GLIPIZIDE 5 MG PO TABS: 5.0000 mg | ORAL_TABLET | Freq: Two times a day (BID) | ORAL | 1 refills | Status: DC

## 2016-01-03 MED ORDER — OMEPRAZOLE 20 MG PO CPDR: 20.0000 mg | DELAYED_RELEASE_CAPSULE | Freq: Every day | ORAL | 0 refills | Status: DC

## 2016-01-03 MED ORDER — EXENATIDE ER 2 MG ~~LOC~~ PEN: 2.0000 mg | PEN_INJECTOR | SUBCUTANEOUS | 2 refills | Status: DC

## 2016-01-03 NOTE — Progress Notes (Signed)
Name: Alan George   MRN: [id number]    DOB: 1955-04-29   Date:01/03/2016       Progress Note  Subjective  Chief Complaint  Chief Complaint  Patient presents with  . Diabetes    follow up, medication refills  . Hypertension  . Hyperlipidemia    Diabetes  He presents for his follow-up diabetic visit. He has type 2 diabetes mellitus. His disease course has been worsening. Pertinent negatives for hypoglycemia include no headaches. Pertinent negatives for diabetes include no blurred vision, no chest pain, no fatigue, no polydipsia and no polyuria. Symptoms are stable. Diabetic complications include nephropathy. Pertinent negatives for diabetic complications include no CVA, heart disease, peripheral neuropathy or retinopathy. Risk factors for coronary artery disease include diabetes mellitus, dyslipidemia, obesity and male sex. Current diabetic treatment includes intensive insulin program and oral agent (dual therapy). He is following a generally healthy (admits to eating a lot of bread) diet. His breakfast blood glucose range is generally 180-200 mg/dl. An ACE inhibitor/angiotensin II receptor blocker is being taken. Eye exam is current.  Hypertension  This is a chronic problem. The problem is unchanged. The problem is controlled. Pertinent negatives include no blurred vision, chest pain, headaches, malaise/fatigue, palpitations or shortness of breath. Past treatments include beta blockers and ACE inhibitors. There is no history of kidney disease, CAD/MI, CVA or retinopathy.  Hyperlipidemia  This is a chronic problem. The problem is controlled. Recent lipid tests were reviewed and are normal. Pertinent negatives include no chest pain, leg pain, myalgias or shortness of breath. Current antihyperlipidemic treatment includes statins.     Past Medical History:  Diagnosis Date  . Allergy   . Asthma   . Diabetes mellitus without complication (HCC)   . GERD (gastroesophageal reflux disease)   .  Hypertension     Past Surgical History:  Procedure Laterality Date  . SPLENECTOMY    . TONSILLECTOMY    . TRACHEOSTOMY      Family History  Problem Relation Age of Onset  . Bipolar disorder Mother   . Cancer Mother     BREAST  . Diabetes Father   . Bipolar disorder Brother   . Cancer Paternal Grandfather     LUNG    Social History   Social History  . Marital status: Married    Spouse name: N/A  . Number of children: N/A  . Years of education: N/A   Occupational History  . Not on file.   Social History Main Topics  . Smoking status: Former Games developer  . Smokeless tobacco: Never Used  . Alcohol use No  . Drug use: No  . Sexual activity: Not on file   Other Topics Concern  . Not on file   Social History Narrative  . No narrative on file     Current Outpatient Prescriptions:  .  albuterol (PROAIR HFA) 108 (90 BASE) MCG/ACT inhaler, Inhale into the lungs., Disp: , Rfl:  .  atorvastatin (LIPITOR) 10 MG tablet, TAKE ONE TABLET BY MOUTH ONCE DAILY, Disp: 90 tablet, Rfl: 0 .  cyclobenzaprine (FLEXERIL) 10 MG tablet, Take by mouth., Disp: , Rfl:  .  glipiZIDE (GLUCOTROL) 5 MG tablet, Take 1 tablet (5 mg total) by mouth 2 (two) times daily before a meal., Disp: 180 tablet, Rfl: 1 .  HYDROmorphone (DILAUDID) 4 MG tablet, take 0.5 tablet by mouth every 4 hours if needed, Disp: , Rfl: 0 .  Insulin Glargine (LANTUS SOLOSTAR) 100 UNIT/ML Solostar Pen, Inject 20 Units into the  skin daily at 10 pm., Disp: 15 mL, Rfl: 11 .  lisinopril (PRINIVIL,ZESTRIL) 2.5 MG tablet, Take 1 tablet (2.5 mg total) by mouth daily., Disp: 90 tablet, Rfl: 0 .  Melatonin 1 MG TABS, Take by mouth., Disp: , Rfl:  .  metFORMIN (GLUCOPHAGE) 1000 MG tablet, Take 1 tablet (1,000 mg total) by mouth 2 (two) times daily with a meal., Disp: 180 tablet, Rfl: 3 .  mirtazapine (REMERON) 30 MG tablet, Take 30 mg by mouth at bedtime., Disp: , Rfl: 0 .  omeprazole (PRILOSEC) 20 MG capsule, TAKE ONE CAPSULE BY MOUTH  ONCE DAILY, Disp: 90 capsule, Rfl: 0 .  propranolol (INDERAL) 40 MG tablet, TAKE ONE TABLET BY MOUTH TWICE DAILY, Disp: 180 tablet, Rfl: 0 .  sertraline (ZOLOFT) 100 MG tablet, Take 200 mg by mouth daily., Disp: , Rfl: 0 .  Vitamin D, Ergocalciferol, (DRISDOL) 50000 UNITS CAPS capsule, Take by mouth., Disp: , Rfl:   Allergies  Allergen Reactions  . Dog Epithelium   . Dust Mite Extract   . Pollen Extract   . Tree Extract   . Iodinated Diagnostic Agents Hives    ?gadolinium-thinks it was after MRI  . Other Rash     Review of Systems  Constitutional: Negative for fatigue and malaise/fatigue.  Eyes: Negative for blurred vision.  Respiratory: Negative for shortness of breath.   Cardiovascular: Negative for chest pain and palpitations.  Musculoskeletal: Negative for myalgias.  Neurological: Negative for headaches.  Endo/Heme/Allergies: Negative for polydipsia.    Objective  Vitals:   01/03/16 0840  BP: 138/72  Pulse: 74  Resp: 18  Temp: 98.7 F (37.1 C)  TempSrc: Oral  SpO2: 94%  Weight: 255 lb 14.4 oz (116.1 kg)  Height: 6' (1.829 m)    Physical Exam  Constitutional: He is oriented to person, place, and time and well-developed, well-nourished, and in no distress.  Cardiovascular: Normal rate, regular rhythm and normal heart sounds.   No murmur heard. Pulmonary/Chest: Effort normal and breath sounds normal. He has no wheezes.  Abdominal: Soft. Bowel sounds are normal. There is no tenderness.  Musculoskeletal: He exhibits no edema.  Neurological: He is alert and oriented to person, place, and time.  Psychiatric: Mood, memory, affect and judgment normal.  Nursing note and vitals reviewed.   Recent Results (from the past 2160 hour(s))  POCT HgB A1C     Status: None   Collection Time: 01/03/16  8:44 AM  Result Value Ref Range   Hemoglobin A1C 12.3   POCT UA - Microalbumin     Status: None   Collection Time: 01/03/16  8:45 AM  Result Value Ref Range   Microalbumin  Ur, POC 100 mg/L   Creatinine, POC  mg/dL   Albumin/Creatinine Ratio, Urine, POC       Assessment & Plan  1. Dyslipidemia Overall FLP at goal, continue on statin therapy - Lipid Profile - COMPLETE METABOLIC PANEL WITH GFR  2. Essential hypertension BP stable and controlled on antihypertensive therapy  3. Uncontrolled type 2 diabetes mellitus with diabetic nephropathy, with long-term current use of insulin (HCC) A1c elevated to 12.3%, urine microalbumin elevated at 100, poorly controlled diabetes. We'll add exenatide 2 mg a week, increase Lantus to 25 units, advised to significantly decrease consumption of bread which may be contributing to elevated sugars. He also received a steroid injection in the back which may have contributed to the exacerbation. - POCT HgB A1C - POCT UA - Microalbumin - Exenatide ER 2 MG PEN;  Inject 2 mg into the skin once a week.  Dispense: 3 each; Refill: 2 - glipiZIDE (GLUCOTROL) 5 MG tablet; Take 1 tablet (5 mg total) by mouth 2 (two) times daily before a meal.  Dispense: 180 tablet; Refill: 1 - Insulin Glargine (LANTUS SOLOSTAR) 100 UNIT/ML Solostar Pen; Inject 25 Units into the skin daily at 10 pm.  Dispense: 15 mL; Refill: 11 - metFORMIN (GLUCOPHAGE) 1000 MG tablet; Take 1 tablet (1,000 mg total) by mouth 2 (two) times daily with a meal.  Dispense: 180 tablet; Refill: 3  4. Gastroesophageal reflux disease, esophagitis presence not specified  - omeprazole (PRILOSEC) 20 MG capsule; Take 1 capsule (20 mg total) by mouth daily.  Dispense: 90 capsule; Refill: 0   5. Need for pneumococcal vaccination  - Pneumococcal conjugate vaccine 13-valent  Yazmina Pareja Asad A. Faylene KurtzShah [facility] 01/03/2016 8:57 AM

## 2016-01-09 ENCOUNTER — Telehealth: Payer: Self-pay

## 2016-01-09 MED ORDER — ATORVASTATIN CALCIUM 40 MG PO TABS: 40.0000 mg | ORAL_TABLET | Freq: Every day | ORAL | 0 refills | Status: DC

## 2016-01-09 NOTE — Telephone Encounter (Signed)
Patient has been notified of lab results and a prescription for atorvastatin 40 mg at bedtime has been sent to [facility] [address] per Dr. Sherryll BurgerShah, patient has been notified

## 2016-01-18 ENCOUNTER — Other Ambulatory Visit: Payer: Self-pay | Admitting: Family Medicine

## 2016-01-18 DIAGNOSIS — I10 Essential (primary) hypertension: Secondary | ICD-10-CM

## 2016-02-23 ENCOUNTER — Telehealth: Payer: Self-pay | Admitting: Family Medicine

## 2016-02-23 DIAGNOSIS — E1121 Type 2 diabetes mellitus with diabetic nephropathy: Secondary | ICD-10-CM

## 2016-02-23 DIAGNOSIS — E1165 Type 2 diabetes mellitus with hyperglycemia: Secondary | ICD-10-CM

## 2016-02-23 DIAGNOSIS — Z794 Long term (current) use of insulin: Principal | ICD-10-CM

## 2016-02-23 DIAGNOSIS — IMO0002 Reserved for concepts with insufficient information to code with codable children: Secondary | ICD-10-CM

## 2016-02-23 MED ORDER — GLIPIZIDE 5 MG PO TABS: 5.0000 mg | ORAL_TABLET | Freq: Two times a day (BID) | ORAL | 1 refills | Status: DC

## 2016-02-23 NOTE — Telephone Encounter (Signed)
Returned call and left a voice message. Patient should be on Exenatide, metformin, glipizide, and Lantus because of poorly controlled diabetes. I have sent a refill for glipizide to his pharmacy. If exenatide is expensive, we'll have to either do a prior authorization or change to a different GLP-1 such as Victoza or Trulicity. Please ask patient to contact me back and we'll be happy to discuss this with them.

## 2016-02-23 NOTE — Telephone Encounter (Signed)
Patient states that you prescribed exenatide er. States that it is very costly (around $400). Patient states that you had once suggested upping glipizide to take the place of the exenatide. Patient has upcoming appointment but will not have enough insulin to last until then. Asking that you please send glipizide to [facility] or if you have an samples that they can use until appointment.

## 2016-03-15 ENCOUNTER — Other Ambulatory Visit: Payer: Self-pay | Admitting: Family Medicine

## 2016-03-15 DIAGNOSIS — K219 Gastro-esophageal reflux disease without esophagitis: Secondary | ICD-10-CM

## 2016-03-26 ENCOUNTER — Other Ambulatory Visit: Payer: Self-pay | Admitting: Family Medicine

## 2016-03-29 DIAGNOSIS — M170 Bilateral primary osteoarthritis of knee: Secondary | ICD-10-CM | POA: Diagnosis not present

## 2016-03-29 DIAGNOSIS — G8929 Other chronic pain: Secondary | ICD-10-CM | POA: Diagnosis not present

## 2016-03-29 DIAGNOSIS — Z5181 Encounter for therapeutic drug level monitoring: Secondary | ICD-10-CM | POA: Diagnosis not present

## 2016-03-29 DIAGNOSIS — M1990 Unspecified osteoarthritis, unspecified site: Secondary | ICD-10-CM | POA: Diagnosis not present

## 2016-03-29 DIAGNOSIS — Z79891 Long term (current) use of opiate analgesic: Secondary | ICD-10-CM | POA: Diagnosis not present

## 2016-03-29 DIAGNOSIS — M5442 Lumbago with sciatica, left side: Secondary | ICD-10-CM | POA: Diagnosis not present

## 2016-04-04 ENCOUNTER — Ambulatory Visit (INDEPENDENT_AMBULATORY_CARE_PROVIDER_SITE_OTHER): Payer: Medicare Other | Admitting: Family Medicine

## 2016-04-04 ENCOUNTER — Encounter: Payer: Self-pay | Admitting: Family Medicine

## 2016-04-04 VITALS — BP 135/94 | HR 75 | Temp 98.1°F | Resp 16 | Ht 72.0 in | Wt 255.7 lb

## 2016-04-04 DIAGNOSIS — R809 Proteinuria, unspecified: Secondary | ICD-10-CM | POA: Diagnosis not present

## 2016-04-04 DIAGNOSIS — Z794 Long term (current) use of insulin: Secondary | ICD-10-CM

## 2016-04-04 DIAGNOSIS — E785 Hyperlipidemia, unspecified: Secondary | ICD-10-CM | POA: Diagnosis not present

## 2016-04-04 DIAGNOSIS — E1129 Type 2 diabetes mellitus with other diabetic kidney complication: Secondary | ICD-10-CM

## 2016-04-04 DIAGNOSIS — I10 Essential (primary) hypertension: Secondary | ICD-10-CM | POA: Diagnosis not present

## 2016-04-04 LAB — COMPLETE METABOLIC PANEL WITH GFR
ALT: 41 U/L (ref 9–46)
AST: 33 U/L (ref 10–35)
Albumin: 4.2 g/dL (ref 3.6–5.1)
Alkaline Phosphatase: 57 U/L (ref 40–115)
BUN: 19 mg/dL (ref 7–25)
CO2: 24 mmol/L (ref 20–31)
CREATININE: 1.11 mg/dL (ref 0.70–1.25)
Calcium: 9.7 mg/dL (ref 8.6–10.3)
Chloride: 101 mmol/L (ref 98–110)
GFR, EST AFRICAN AMERICAN: 83 mL/min (ref >=60)
GFR, EST NON AFRICAN AMERICAN: 72 mL/min (ref >=60)
Glucose, Bld: 148 mg/dL — ABNORMAL HIGH (ref 65–99)
POTASSIUM: 4.8 mmol/L (ref 3.5–5.3)
Sodium: 136 mmol/L (ref 135–146)
Total Bilirubin: 0.5 mg/dL (ref 0.2–1.2)
Total Protein: 7.5 g/dL (ref 6.1–8.1)

## 2016-04-04 LAB — POCT GLYCOSYLATED HEMOGLOBIN (HGB A1C): Hemoglobin A1C: 8.7

## 2016-04-04 LAB — LIPID PANEL
CHOL/HDL RATIO: 3.4 ratio (ref <5.0)
Cholesterol: 128 mg/dL (ref <200)
HDL: 38 mg/dL — ABNORMAL LOW (ref >40)
LDL CALC: 58 mg/dL (ref <100)
Triglycerides: 162 mg/dL — ABNORMAL HIGH (ref <150)
VLDL: 32 mg/dL — AB (ref <30)

## 2016-04-04 LAB — GLUCOSE, POCT (MANUAL RESULT ENTRY): POC Glucose: 122 mg/dl — AB (ref 70–99)

## 2016-04-04 MED ORDER — LISINOPRIL 5 MG PO TABS: 5.0000 mg | ORAL_TABLET | Freq: Every day | ORAL | 0 refills | Status: DC

## 2016-04-04 MED ORDER — ATORVASTATIN CALCIUM 40 MG PO TABS: 40.0000 mg | ORAL_TABLET | Freq: Every day | ORAL | 1 refills | Status: DC

## 2016-04-04 NOTE — Progress Notes (Signed)
Name: Alan George   MRN: [id number]    DOB: [birth year]   Date:04/04/2016       Progress Note  Subjective  Chief Complaint  Chief Complaint  Patient presents with  . Follow-up    3 mo  . Labs Only    Fasting    Diabetes  He presents for his follow-up diabetic visit. He has type 2 diabetes mellitus. His disease course has been improving. There are no hypoglycemic associated symptoms. Pertinent negatives for hypoglycemia include no headaches. Pertinent negatives for diabetes include no blurred vision, no chest pain, no fatigue, no foot paresthesias, no polydipsia and no polyuria. Symptoms are stable. Diabetic complications include nephropathy. Pertinent negatives for diabetic complications include no CVA, heart disease, peripheral neuropathy or retinopathy. Risk factors for coronary artery disease include diabetes mellitus, dyslipidemia, obesity and male sex. Current diabetic treatment includes intensive insulin program and oral agent (dual therapy). He is following a diabetic and generally healthy diet. His breakfast blood glucose range is generally 110-130 mg/dl. An ACE inhibitor/angiotensin II receptor blocker is being taken. Eye exam is current.  Hypertension  This is a chronic problem. The problem is unchanged. The problem is controlled. Pertinent negatives include no blurred vision, chest pain, headaches, malaise/fatigue, palpitations or shortness of breath. Past treatments include beta blockers and ACE inhibitors. There is no history of kidney disease, CAD/MI, CVA or retinopathy.  Hyperlipidemia  This is a chronic problem. The problem is uncontrolled. Recent lipid tests were reviewed and are normal. Pertinent negatives include no chest pain, leg pain, myalgias or shortness of breath. Current antihyperlipidemic treatment includes statins.    Past Medical History:  Diagnosis Date  . Allergy   . Asthma   . Diabetes mellitus without complication (HCC)   . GERD (gastroesophageal reflux  disease)   . Hypertension     Past Surgical History:  Procedure Laterality Date  . SPLENECTOMY    . TONSILLECTOMY    . TRACHEOSTOMY      Family History  Problem Relation Age of Onset  . Bipolar disorder Mother   . Cancer Mother     BREAST  . Diabetes Father   . Bipolar disorder Brother   . Cancer Paternal Grandfather     LUNG    Social History   Social History  . Marital status: Married    Spouse name: N/A  . Number of children: N/A  . Years of education: N/A   Occupational History  . Not on file.   Social History Main Topics  . Smoking status: Former Games developer  . Smokeless tobacco: Never Used  . Alcohol use No  . Drug use: No  . Sexual activity: Not on file   Other Topics Concern  . Not on file   Social History Narrative  . No narrative on file     Current Outpatient Prescriptions:  .  albuterol (PROAIR HFA) 108 (90 BASE) MCG/ACT inhaler, Inhale into the lungs., Disp: , Rfl:  .  atorvastatin (LIPITOR) 40 MG tablet, TAKE ONE TABLET BY MOUTH AT BEDTIME, Disp: 90 tablet, Rfl: 0 .  cyclobenzaprine (FLEXERIL) 10 MG tablet, Take by mouth., Disp: , Rfl:  .  glipiZIDE (GLUCOTROL) 5 MG tablet, Take 1 tablet (5 mg total) by mouth 2 (two) times daily before a meal., Disp: 180 tablet, Rfl: 1 .  HYDROmorphone (DILAUDID) 4 MG tablet, take 0.5 tablet by mouth every 4 hours if needed, Disp: , Rfl: 0 .  Insulin Glargine (LANTUS SOLOSTAR) 100 UNIT/ML Solostar Pen, Inject 25  Units into the skin daily at 10 pm., Disp: 15 mL, Rfl: 11 .  lisinopril (PRINIVIL,ZESTRIL) 2.5 MG tablet, Take 1 tablet (2.5 mg total) by mouth daily., Disp: 90 tablet, Rfl: 0 .  Melatonin 1 MG TABS, Take by mouth., Disp: , Rfl:  .  metFORMIN (GLUCOPHAGE) 1000 MG tablet, Take 1 tablet (1,000 mg total) by mouth 2 (two) times daily with a meal., Disp: 180 tablet, Rfl: 3 .  mirtazapine (REMERON) 30 MG tablet, Take 30 mg by mouth at bedtime., Disp: , Rfl: 0 .  omeprazole (PRILOSEC) 20 MG capsule, TAKE ONE  CAPSULE BY MOUTH ONCE DAILY, Disp: 90 capsule, Rfl: 0 .  propranolol (INDERAL) 40 MG tablet, TAKE ONE TABLET BY MOUTH TWICE DAILY, Disp: 180 tablet, Rfl: 0 .  sertraline (ZOLOFT) 100 MG tablet, Take 200 mg by mouth daily., Disp: , Rfl: 0 .  Vitamin D, Ergocalciferol, (DRISDOL) 50000 UNITS CAPS capsule, Take by mouth., Disp: , Rfl:   Allergies  Allergen Reactions  . Dog Epithelium   . Dust Mite Extract   . Pollen Extract   . Tree Extract   . Iodinated Diagnostic Agents Hives    ?gadolinium-thinks it was after MRI  . Other Rash     Review of Systems  Constitutional: Negative for fatigue and malaise/fatigue.  Eyes: Negative for blurred vision.  Respiratory: Negative for shortness of breath.   Cardiovascular: Negative for chest pain and palpitations.  Musculoskeletal: Negative for myalgias.  Neurological: Negative for headaches.  Endo/Heme/Allergies: Negative for polydipsia.    Objective  Vitals:   04/04/16 0825  BP: (!) 135/94  Pulse: 75  Resp: 16  Temp: 98.1 F (36.7 C)  TempSrc: Oral  SpO2: 96%  Weight: 255 lb 11.2 oz (116 kg)  Height: 6' (1.829 m)    Physical Exam  Constitutional: He is oriented to person, place, and time and well-developed, well-nourished, and in no distress.  HENT:  Head: Normocephalic and atraumatic.  Cardiovascular: Normal rate, regular rhythm and normal heart sounds.   Pulmonary/Chest: Effort normal and breath sounds normal.  Abdominal: Soft. Bowel sounds are normal.  Neurological: He is alert and oriented to person, place, and time.  Psychiatric: Mood, memory, affect and judgment normal.  Nursing note and vitals reviewed.      Recent Results (from the past 2160 hour(s))  POCT HgB A1C     Status: Abnormal   Collection Time: 04/04/16  8:34 AM  Result Value Ref Range   Hemoglobin A1C 8.7   POCT Glucose (CBG)     Status: Abnormal   Collection Time: 04/04/16  8:34 AM  Result Value Ref Range   POC Glucose 122 (A) 70 - 99 mg/dl      Assessment & Plan  1. Type 2 diabetes mellitus with microalbuminuria, with long-term current use of insulin (HCC) A1c is significantly improved, down from 12.3% to 8.4% over 3 months. Advised to continue on dietary interventions, not taking Bydureon. Continue on insulin, glipizide and metformin - POCT HgB A1C - POCT Glucose (CBG) - lisinopril (PRINIVIL,ZESTRIL) 5 MG tablet; Take 1 tablet (5 mg total) by mouth daily.  Dispense: 90 tablet; Refill: 0  2. Essential hypertension Because of elevated blood pressure, we will increase lisinopril to 5 mg daily for optimal management of hypertension and nephropathy. Recheck in 3 month  3. Dyslipidemia Expect improvement in fasting lipid profile after changing dietary and lifestyle factors. Continue on statin - atorvastatin (LIPITOR) 40 MG tablet; Take 1 tablet (40 mg total) by mouth at  bedtime.  Dispense: 90 tablet; Refill: 1 - Lipid panel - COMPLETE METABOLIC PANEL WITH GFR   Kamori Barbier Asad A. Faylene Kurtz [facility] 04/04/2016 8:41 AM

## 2016-05-02 ENCOUNTER — Other Ambulatory Visit: Payer: Self-pay | Admitting: Family Medicine

## 2016-05-02 DIAGNOSIS — I10 Essential (primary) hypertension: Secondary | ICD-10-CM

## 2016-06-25 ENCOUNTER — Other Ambulatory Visit: Payer: Self-pay | Admitting: Family Medicine

## 2016-06-25 DIAGNOSIS — R809 Proteinuria, unspecified: Principal | ICD-10-CM

## 2016-06-25 DIAGNOSIS — Z794 Long term (current) use of insulin: Principal | ICD-10-CM

## 2016-06-25 DIAGNOSIS — E1129 Type 2 diabetes mellitus with other diabetic kidney complication: Secondary | ICD-10-CM

## 2016-06-26 ENCOUNTER — Other Ambulatory Visit: Payer: Self-pay | Admitting: Family Medicine

## 2016-06-26 DIAGNOSIS — K219 Gastro-esophageal reflux disease without esophagitis: Secondary | ICD-10-CM

## 2016-06-26 DIAGNOSIS — M1610 Unilateral primary osteoarthritis, unspecified hip: Secondary | ICD-10-CM | POA: Diagnosis not present

## 2016-06-26 DIAGNOSIS — M25561 Pain in right knee: Secondary | ICD-10-CM | POA: Diagnosis not present

## 2016-06-26 DIAGNOSIS — M5442 Lumbago with sciatica, left side: Secondary | ICD-10-CM | POA: Diagnosis not present

## 2016-06-26 DIAGNOSIS — Z5181 Encounter for therapeutic drug level monitoring: Secondary | ICD-10-CM | POA: Diagnosis not present

## 2016-07-03 ENCOUNTER — Ambulatory Visit (INDEPENDENT_AMBULATORY_CARE_PROVIDER_SITE_OTHER): Payer: Medicare Other | Admitting: Family Medicine

## 2016-07-03 ENCOUNTER — Encounter: Payer: Self-pay | Admitting: Family Medicine

## 2016-07-03 VITALS — BP 138/76 | HR 77 | Temp 98.5°F | Resp 16 | Ht 72.0 in | Wt 257.0 lb

## 2016-07-03 DIAGNOSIS — I10 Essential (primary) hypertension: Secondary | ICD-10-CM

## 2016-07-03 DIAGNOSIS — E1121 Type 2 diabetes mellitus with diabetic nephropathy: Secondary | ICD-10-CM | POA: Diagnosis not present

## 2016-07-03 DIAGNOSIS — E785 Hyperlipidemia, unspecified: Secondary | ICD-10-CM

## 2016-07-03 DIAGNOSIS — IMO0002 Reserved for concepts with insufficient information to code with codable children: Secondary | ICD-10-CM

## 2016-07-03 DIAGNOSIS — E1165 Type 2 diabetes mellitus with hyperglycemia: Secondary | ICD-10-CM | POA: Diagnosis not present

## 2016-07-03 DIAGNOSIS — Z794 Long term (current) use of insulin: Secondary | ICD-10-CM | POA: Diagnosis not present

## 2016-07-03 LAB — COMPLETE METABOLIC PANEL WITH GFR
ALBUMIN: 4.3 g/dL (ref 3.6–5.1)
ALK PHOS: 55 U/L (ref 40–115)
ALT: 41 U/L (ref 9–46)
AST: 32 U/L (ref 10–35)
BUN: 22 mg/dL (ref 7–25)
CALCIUM: 9.5 mg/dL (ref 8.6–10.3)
CHLORIDE: 103 mmol/L (ref 98–110)
CO2: 17 mmol/L — ABNORMAL LOW (ref 20–31)
Creat: 1.02 mg/dL (ref 0.70–1.25)
GFR, EST NON AFRICAN AMERICAN: 80 mL/min (ref >=60)
Glucose, Bld: 174 mg/dL — ABNORMAL HIGH (ref 65–99)
POTASSIUM: 4.8 mmol/L (ref 3.5–5.3)
Sodium: 137 mmol/L (ref 135–146)
Total Bilirubin: 0.4 mg/dL (ref 0.2–1.2)
Total Protein: 7.8 g/dL (ref 6.1–8.1)

## 2016-07-03 LAB — LIPID PANEL
CHOL/HDL RATIO: 2.9 ratio (ref <5.0)
CHOLESTEROL: 123 mg/dL (ref <200)
HDL: 43 mg/dL (ref >40)
LDL Cholesterol: 55 mg/dL (ref <100)
TRIGLYCERIDES: 127 mg/dL (ref <150)
VLDL: 25 mg/dL (ref <30)

## 2016-07-03 LAB — GLUCOSE, POCT (MANUAL RESULT ENTRY): POC GLUCOSE: 158 mg/dL — AB (ref 70–99)

## 2016-07-03 LAB — POCT GLYCOSYLATED HEMOGLOBIN (HGB A1C): HEMOGLOBIN A1C: 8.5

## 2016-07-03 MED ORDER — ATORVASTATIN CALCIUM 40 MG PO TABS: 40.0000 mg | ORAL_TABLET | Freq: Every day | ORAL | 1 refills | Status: DC

## 2016-07-03 MED ORDER — GLIPIZIDE 5 MG PO TABS: 5.0000 mg | ORAL_TABLET | Freq: Two times a day (BID) | ORAL | 1 refills | Status: DC

## 2016-07-03 MED ORDER — PROPRANOLOL HCL 40 MG PO TABS: 40.0000 mg | ORAL_TABLET | Freq: Two times a day (BID) | ORAL | 0 refills | Status: DC

## 2016-07-03 NOTE — Progress Notes (Signed)
Name: Alan QueenRobert George   MRN: [id number]    DOB: 12/03/1955   Date:07/03/2016       Progress Note  Subjective  Chief Complaint  Chief Complaint  Patient presents with  . Follow-up    3 mo  . Medication Refill    Diabetes  He presents for his follow-up diabetic visit. He has type 2 diabetes mellitus. His disease course has been improving. There are no hypoglycemic associated symptoms. Pertinent negatives for hypoglycemia include no headaches. Pertinent negatives for diabetes include no blurred vision, no foot paresthesias, no polydipsia and no polyuria. Symptoms are stable. Diabetic complications include nephropathy. Pertinent negatives for diabetic complications include no CVA, heart disease, peripheral neuropathy or retinopathy. Risk factors for coronary artery disease include diabetes mellitus, dyslipidemia, obesity and male sex. Current diabetic treatment includes intensive insulin program and oral agent (dual therapy). He is following a diabetic and generally healthy diet. His breakfast blood glucose range is generally 140-180 mg/dl. An ACE inhibitor/angiotensin II receptor blocker is being taken. Eye exam is current.  Hypertension  This is a chronic problem. The problem is unchanged. The problem is controlled. Pertinent negatives include no blurred vision, headaches, malaise/fatigue, palpitations or shortness of breath. Past treatments include beta blockers and ACE inhibitors. There is no history of kidney disease, CAD/MI, CVA or retinopathy.  Hyperlipidemia  This is a chronic problem. The problem is uncontrolled. Recent lipid tests were reviewed and are high (elevated Triglycerides). Pertinent negatives include no leg pain, myalgias or shortness of breath. Current antihyperlipidemic treatment includes statins.     Past Medical History:  Diagnosis Date  . Allergy   . Asthma   . Diabetes mellitus without complication (HCC)   . GERD (gastroesophageal reflux disease)   . Hypertension      Past Surgical History:  Procedure Laterality Date  . SPLENECTOMY    . TONSILLECTOMY    . TRACHEOSTOMY      Family History  Problem Relation Age of Onset  . Bipolar disorder Mother   . Cancer Mother        BREAST  . Diabetes Father   . Bipolar disorder Brother   . Cancer Paternal Grandfather        LUNG    Social History   Social History  . Marital status: Married    Spouse name: N/A  . Number of children: N/A  . Years of education: N/A   Occupational History  . Not on file.   Social History Main Topics  . Smoking status: Former Games developermoker  . Smokeless tobacco: Never Used  . Alcohol use No  . Drug use: No  . Sexual activity: Not on file   Other Topics Concern  . Not on file   Social History Narrative  . No narrative on file     Current Outpatient Prescriptions:  .  albuterol (PROAIR HFA) 108 (90 BASE) MCG/ACT inhaler, Inhale into the lungs., Disp: , Rfl:  .  atorvastatin (LIPITOR) 40 MG tablet, Take 1 tablet (40 mg total) by mouth at bedtime., Disp: 90 tablet, Rfl: 1 .  cyclobenzaprine (FLEXERIL) 10 MG tablet, Take by mouth., Disp: , Rfl:  .  glipiZIDE (GLUCOTROL) 5 MG tablet, Take 1 tablet (5 mg total) by mouth 2 (two) times daily before a meal., Disp: 180 tablet, Rfl: 1 .  HYDROmorphone (DILAUDID) 4 MG tablet, take 0.5 tablet by mouth every 4 hours if needed, Disp: , Rfl: 0 .  Insulin Glargine (LANTUS SOLOSTAR) 100 UNIT/ML Solostar Pen, Inject 25 Units  into the skin daily at 10 pm., Disp: 15 mL, Rfl: 11 .  lisinopril (PRINIVIL,ZESTRIL) 5 MG tablet, TAKE 1 TABLET BY MOUTH ONCE DAILY, Disp: 90 tablet, Rfl: 0 .  Melatonin 1 MG TABS, Take by mouth., Disp: , Rfl:  .  metFORMIN (GLUCOPHAGE) 1000 MG tablet, Take 1 tablet (1,000 mg total) by mouth 2 (two) times daily with a meal., Disp: 180 tablet, Rfl: 3 .  mirtazapine (REMERON) 30 MG tablet, Take 30 mg by mouth at bedtime., Disp: , Rfl: 0 .  omeprazole (PRILOSEC) 20 MG capsule, TAKE ONE CAPSULE BY MOUTH ONCE DAILY,  Disp: 90 capsule, Rfl: 0 .  propranolol (INDERAL) 40 MG tablet, TAKE ONE TABLET BY MOUTH TWICE DAILY, Disp: 180 tablet, Rfl: 0 .  sertraline (ZOLOFT) 100 MG tablet, Take 200 mg by mouth daily., Disp: , Rfl: 0 .  Vitamin D, Ergocalciferol, (DRISDOL) 50000 UNITS CAPS capsule, Take by mouth., Disp: , Rfl:   Allergies  Allergen Reactions  . Dog Epithelium   . Dust Mite Extract   . Pollen Extract   . Tree Extract   . Iodinated Diagnostic Agents Hives    ?gadolinium-thinks it was after MRI  . Other Rash     Review of Systems  Constitutional: Negative for malaise/fatigue.  Eyes: Negative for blurred vision.  Respiratory: Negative for shortness of breath.   Cardiovascular: Negative for palpitations.  Musculoskeletal: Negative for myalgias.  Neurological: Negative for headaches.  Endo/Heme/Allergies: Negative for polydipsia.      Objective  Vitals:   07/03/16 0844  BP: 138/76  Pulse: 77  Resp: 16  Temp: 98.5 F (36.9 C)  TempSrc: Oral  SpO2: 96%  Weight: 257 lb (116.6 kg)  Height: 6' (1.829 m)    Physical Exam  Constitutional: He is oriented to person, place, and time and well-developed, well-nourished, and in no distress.  HENT:  Head: Normocephalic and atraumatic.  Cardiovascular: Normal rate, regular rhythm and normal heart sounds.   Pulmonary/Chest: Effort normal and breath sounds normal.  Abdominal: Soft. Bowel sounds are normal.  Musculoskeletal: He exhibits no edema.  Neurological: He is alert and oriented to person, place, and time.  Psychiatric: Mood, memory, affect and judgment normal.  Nursing note and vitals reviewed.     Assessment & Plan  1. Essential hypertension BP stable on present antihypertensive therapy - propranolol (INDERAL) 40 MG tablet; Take 1 tablet (40 mg total) by mouth 2 (two) times daily.  Dispense: 180 tablet; Refill: 0  2. Dyslipidemia Advised increase physical activity, which can result in lowering of triglycerides -  atorvastatin (LIPITOR) 40 MG tablet; Take 1 tablet (40 mg total) by mouth at bedtime.  Dispense: 90 tablet; Refill: 1 - Lipid panel - COMPLETE METABOLIC PANEL WITH GFR  3. Uncontrolled type 2 diabetes mellitus with diabetic nephropathy, with long-term current use of insulin (HCC) Improved from 8.7% to 8.5% in 3 months, continue on pharmacotherapy unchanged, advised to increase physical activity - POCT HgB A1C - POCT Glucose (CBG) - glipiZIDE (GLUCOTROL) 5 MG tablet; Take 1 tablet (5 mg total) by mouth 2 (two) times daily before a meal.  Dispense: 180 tablet; Refill: 1   Meilech Virts [facility] 07/03/2016 8:54 AM

## 2016-07-05 ENCOUNTER — Encounter: Payer: Self-pay | Admitting: Family Medicine

## 2016-08-08 ENCOUNTER — Encounter: Payer: Medicare Other | Admitting: Family Medicine

## 2016-09-03 ENCOUNTER — Ambulatory Visit (INDEPENDENT_AMBULATORY_CARE_PROVIDER_SITE_OTHER): Payer: Medicare Other | Admitting: Family Medicine

## 2016-09-03 ENCOUNTER — Encounter: Payer: Self-pay | Admitting: Family Medicine

## 2016-09-03 VITALS — BP 135/78 | HR 77 | Temp 98.4°F | Resp 16 | Ht 72.0 in | Wt 256.0 lb

## 2016-09-03 DIAGNOSIS — M25561 Pain in right knee: Secondary | ICD-10-CM | POA: Diagnosis not present

## 2016-09-03 DIAGNOSIS — G8929 Other chronic pain: Secondary | ICD-10-CM

## 2016-09-03 DIAGNOSIS — M25562 Pain in left knee: Secondary | ICD-10-CM | POA: Diagnosis not present
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
## 2016-09-09 ENCOUNTER — Other Ambulatory Visit: Payer: Self-pay | Admitting: Family Medicine

## 2016-09-09 DIAGNOSIS — K219 Gastro-esophageal reflux disease without esophagitis: Secondary | ICD-10-CM

## 2016-09-13 DIAGNOSIS — F329 Major depressive disorder, single episode, unspecified: Secondary | ICD-10-CM | POA: Diagnosis not present

## 2016-09-26 DIAGNOSIS — Z5181 Encounter for therapeutic drug level monitoring: Secondary | ICD-10-CM | POA: Diagnosis not present

## 2016-09-26 DIAGNOSIS — M5442 Lumbago with sciatica, left side: Secondary | ICD-10-CM | POA: Diagnosis not present

## 2016-09-26 DIAGNOSIS — M1610 Unilateral primary osteoarthritis, unspecified hip: Secondary | ICD-10-CM | POA: Diagnosis not present

## 2016-09-26 DIAGNOSIS — M170 Bilateral primary osteoarthritis of knee: Secondary | ICD-10-CM | POA: Diagnosis not present

## 2016-10-18 ENCOUNTER — Other Ambulatory Visit: Payer: Self-pay | Admitting: Family Medicine

## 2016-10-18 DIAGNOSIS — I10 Essential (primary) hypertension: Secondary | ICD-10-CM

## 2016-10-24 ENCOUNTER — Other Ambulatory Visit: Payer: Self-pay | Admitting: Family Medicine

## 2016-10-24 DIAGNOSIS — I10 Essential (primary) hypertension: Secondary | ICD-10-CM

## 2016-10-28 ENCOUNTER — Other Ambulatory Visit: Payer: Self-pay | Admitting: Family Medicine

## 2016-10-28 DIAGNOSIS — I10 Essential (primary) hypertension: Secondary | ICD-10-CM

## 2016-12-01 ENCOUNTER — Other Ambulatory Visit: Payer: Self-pay | Admitting: Family Medicine

## 2016-12-01 DIAGNOSIS — E1129 Type 2 diabetes mellitus with other diabetic kidney complication: Secondary | ICD-10-CM

## 2016-12-01 DIAGNOSIS — Z794 Long term (current) use of insulin: Principal | ICD-10-CM

## 2016-12-01 DIAGNOSIS — R809 Proteinuria, unspecified: Principal | ICD-10-CM

## 2016-12-04 ENCOUNTER — Other Ambulatory Visit: Payer: Self-pay | Admitting: Family Medicine

## 2016-12-04 DIAGNOSIS — K219 Gastro-esophageal reflux disease without esophagitis: Secondary | ICD-10-CM

## 2016-12-04 DIAGNOSIS — I10 Essential (primary) hypertension: Secondary | ICD-10-CM

## 2016-12-10 LAB — HM DIABETES EYE EXAM

## 2016-12-19 ENCOUNTER — Ambulatory Visit (INDEPENDENT_AMBULATORY_CARE_PROVIDER_SITE_OTHER): Payer: Medicare Other | Admitting: Family Medicine

## 2016-12-19 VITALS — BP 150/78 | HR 108 | Temp 98.1°F | Resp 16 | Ht 72.0 in | Wt 257.1 lb

## 2016-12-19 DIAGNOSIS — Z794 Long term (current) use of insulin: Secondary | ICD-10-CM | POA: Diagnosis not present

## 2016-12-19 DIAGNOSIS — I10 Essential (primary) hypertension: Secondary | ICD-10-CM | POA: Diagnosis not present

## 2016-12-19 DIAGNOSIS — E1129 Type 2 diabetes mellitus with other diabetic kidney complication: Secondary | ICD-10-CM | POA: Diagnosis not present

## 2016-12-19 DIAGNOSIS — E1121 Type 2 diabetes mellitus with diabetic nephropathy: Secondary | ICD-10-CM | POA: Diagnosis not present

## 2016-12-19 DIAGNOSIS — E1165 Type 2 diabetes mellitus with hyperglycemia: Secondary | ICD-10-CM | POA: Diagnosis not present

## 2016-12-19 DIAGNOSIS — E785 Hyperlipidemia, unspecified: Secondary | ICD-10-CM

## 2016-12-19 DIAGNOSIS — K219 Gastro-esophageal reflux disease without esophagitis: Secondary | ICD-10-CM

## 2016-12-19 DIAGNOSIS — R809 Proteinuria, unspecified: Secondary | ICD-10-CM | POA: Diagnosis not present

## 2016-12-19 LAB — POCT GLYCOSYLATED HEMOGLOBIN (HGB A1C): HEMOGLOBIN A1C: 8.2

## 2016-12-19 LAB — GLUCOSE, POCT (MANUAL RESULT ENTRY): POC GLUCOSE: 143 mg/dL — AB (ref 70–99)

## 2016-12-19 LAB — LIPID PANEL
CHOL/HDL RATIO: 2.9 (calc) (ref <5.0)
CHOLESTEROL: 137 mg/dL (ref <200)
HDL: 48 mg/dL (ref >40)
LDL Cholesterol (Calc): 68 mg/dL (calc)
Non-HDL Cholesterol (Calc): 89 mg/dL (calc) (ref <130)
Triglycerides: 127 mg/dL (ref <150)

## 2016-12-19 MED ORDER — PROPRANOLOL HCL 40 MG PO TABS: 40.0000 mg | ORAL_TABLET | Freq: Two times a day (BID) | ORAL | 0 refills | Status: DC

## 2016-12-19 MED ORDER — GLIPIZIDE 5 MG PO TABS: 5.0000 mg | ORAL_TABLET | Freq: Two times a day (BID) | ORAL | 1 refills | Status: DC

## 2016-12-19 MED ORDER — INSULIN GLARGINE 100 UNIT/ML SOLOSTAR PEN: 25.0000 [IU] | PEN_INJECTOR | Freq: Every day | SUBCUTANEOUS | 11 refills | Status: DC

## 2016-12-19 MED ORDER — ATORVASTATIN CALCIUM 40 MG PO TABS: 40.0000 mg | ORAL_TABLET | Freq: Every day | ORAL | 1 refills | Status: DC

## 2016-12-19 MED ORDER — METFORMIN HCL 1000 MG PO TABS: 1000.0000 mg | ORAL_TABLET | Freq: Two times a day (BID) | ORAL | 3 refills | Status: DC

## 2016-12-19 MED ORDER — OMEPRAZOLE 20 MG PO CPDR: 20.0000 mg | DELAYED_RELEASE_CAPSULE | Freq: Every day | ORAL | 0 refills | Status: DC

## 2016-12-19 NOTE — Progress Notes (Signed)
Name: Alan George   MRN: [id number]    DOB: 08/25/1955   Date:12/19/2016       Progress Note  Subjective  Chief Complaint  Chief Complaint  Patient presents with  . Medication Refill    metformin and heart burn meds  . medication mangement    questions about change of BP medication     Diabetes  He presents for his follow-up diabetic visit. He has type 2 diabetes mellitus. His disease course has been improving. There are no hypoglycemic associated symptoms. Pertinent negatives for hypoglycemia include no dizziness, headaches or sweats. Pertinent negatives for diabetes include no blurred vision, no fatigue, no foot paresthesias, no polydipsia and no polyuria. Symptoms are stable. Diabetic complications include nephropathy. Pertinent negatives for diabetic complications include no CVA, heart disease, peripheral neuropathy or retinopathy. Risk factors for coronary artery disease include diabetes mellitus, dyslipidemia, obesity and male sex. Current diabetic treatment includes intensive insulin program and oral agent (dual therapy). He is following a diabetic and generally healthy diet. His breakfast blood glucose range is generally 140-180 mg/dl. An ACE inhibitor/angiotensin II receptor blocker is being taken. Eye exam is current.  Hypertension  This is a chronic problem. The problem is unchanged. The problem is controlled. Pertinent negatives include no blurred vision, headaches, malaise/fatigue, palpitations, shortness of breath or sweats. Past treatments include beta blockers and ACE inhibitors. There is no history of kidney disease, CAD/MI, CVA or retinopathy.  Hyperlipidemia  This is a chronic problem. The problem is controlled. Recent lipid tests were reviewed and are normal. Pertinent negatives include no leg pain, myalgias or shortness of breath. Current antihyperlipidemic treatment includes statins.     Past Medical History:  Diagnosis Date  . Allergy   . Asthma   . Diabetes mellitus  without complication (HCC)   . GERD (gastroesophageal reflux disease)   . Hypertension     Past Surgical History:  Procedure Laterality Date  . SPLENECTOMY    . TONSILLECTOMY    . TRACHEOSTOMY      Family History  Problem Relation Age of Onset  . Bipolar disorder Mother   . Cancer Mother        BREAST  . Diabetes Father   . Bipolar disorder Brother   . Cancer Paternal Grandfather        LUNG    Social History   Social History  . Marital status: Married    Spouse name: N/A  . Number of children: N/A  . Years of education: N/A   Occupational History  . Not on file.   Social History Main Topics  . Smoking status: Former Games developer  . Smokeless tobacco: Never Used  . Alcohol use No  . Drug use: No  . Sexual activity: Not on file   Other Topics Concern  . Not on file   Social History Narrative  . No narrative on file     Current Outpatient Prescriptions:  .  albuterol (PROAIR HFA) 108 (90 BASE) MCG/ACT inhaler, Inhale into the lungs., Disp: , Rfl:  .  atorvastatin (LIPITOR) 40 MG tablet, Take 1 tablet (40 mg total) by mouth at bedtime., Disp: 90 tablet, Rfl: 1 .  cyclobenzaprine (FLEXERIL) 10 MG tablet, Take by mouth., Disp: , Rfl:  .  glipiZIDE (GLUCOTROL) 5 MG tablet, Take 1 tablet (5 mg total) by mouth 2 (two) times daily before a meal., Disp: 180 tablet, Rfl: 1 .  HYDROmorphone (DILAUDID) 4 MG tablet, take 0.5 tablet by mouth every 4 hours if  needed, Disp: , Rfl: 0 .  Insulin Glargine (LANTUS SOLOSTAR) 100 UNIT/ML Solostar Pen, Inject 25 Units into the skin daily at 10 pm., Disp: 15 mL, Rfl: 11 .  lisinopril (PRINIVIL,ZESTRIL) 5 MG tablet, TAKE 1 TABLET BY MOUTH ONCE DAILY, Disp: 90 tablet, Rfl: 0 .  Melatonin 1 MG TABS, Take by mouth., Disp: , Rfl:  .  metFORMIN (GLUCOPHAGE) 1000 MG tablet, Take 1 tablet (1,000 mg total) by mouth 2 (two) times daily with a meal., Disp: 180 tablet, Rfl: 3 .  mirtazapine (REMERON) 30 MG tablet, Take 30 mg by mouth at bedtime.,  Disp: , Rfl: 0 .  omeprazole (PRILOSEC) 20 MG capsule, TAKE 1 CAPSULE BY MOUTH ONCE DAILY, Disp: 90 capsule, Rfl: 0 .  sertraline (ZOLOFT) 100 MG tablet, Take 200 mg by mouth daily., Disp: , Rfl: 0 .  propranolol (INDERAL) 40 MG tablet, Take 1 tablet (40 mg total) by mouth 2 (two) times daily. (Patient not taking: Reported on 12/19/2016), Disp: 180 tablet, Rfl: 0 .  Vitamin D, Ergocalciferol, (DRISDOL) 50000 UNITS CAPS capsule, Take by mouth., Disp: , Rfl:   Allergies  Allergen Reactions  . Dog Epithelium   . Dust Mite Extract   . Pollen Extract   . Tree Extract   . Iodinated Diagnostic Agents Hives    ?gadolinium-thinks it was after MRI  . Other Rash     Review of Systems  Constitutional: Negative for fatigue and malaise/fatigue.  Eyes: Negative for blurred vision.  Respiratory: Negative for shortness of breath.   Cardiovascular: Negative for palpitations.  Musculoskeletal: Negative for myalgias.  Neurological: Negative for dizziness and headaches.  Endo/Heme/Allergies: Negative for polydipsia.    Objective  Vitals:   12/19/16 0851 12/19/16 0857 12/19/16 0858  BP: (!) 156/80  (!) 150/78  Pulse: (!) 117 (!) 108   Resp: 16    Temp: 98.1 F (36.7 C)    TempSrc: Oral    SpO2: 94%    Weight: 257 lb 1.6 oz (116.6 kg)    Height: 6' (1.829 m)      Physical Exam  Constitutional: He is oriented to person, place, and time and well-developed, well-nourished, and in no distress.  HENT:  Head: Normocephalic and atraumatic.  Cardiovascular: Normal rate, regular rhythm and normal heart sounds.   Pulmonary/Chest: Effort normal and breath sounds normal.  Abdominal: Soft. Bowel sounds are normal.  Musculoskeletal: He exhibits no edema.  Neurological: He is alert and oriented to person, place, and time.  Psychiatric: Mood, memory, affect and judgment normal.  Nursing note and vitals reviewed.   Recent Results (from the past 2160 hour(s))  HM DIABETES EYE EXAM     Status: None    Collection Time: 12/10/16 12:00 AM  Result Value Ref Range   HM Diabetic Eye Exam No Retinopathy No Retinopathy  POCT Glucose (CBG)     Status: Abnormal   Collection Time: 12/19/16  8:56 AM  Result Value Ref Range   POC Glucose 143 (A) 70 - 99 mg/dl  POCT HgB Z6X     Status: Abnormal   Collection Time: 12/19/16  8:57 AM  Result Value Ref Range   Hemoglobin A1C 8.2      Assessment & Plan  1. Type 2 diabetes mellitus with microalbuminuria, with long-term current use of insulin (HCC) A1c improved at 8.2%, continue on glipizide, metformin and Lantus. Obtain urine microalbumin - POCT HgB A1C - POCT Glucose (CBG) - glipiZIDE (GLUCOTROL) 5 MG tablet; Take 1 tablet (5 mg total)  by mouth 2 (two) times daily before a meal.  Dispense: 180 tablet; Refill: 1 - Insulin Glargine (LANTUS SOLOSTAR) 100 UNIT/ML Solostar Pen; Inject 25 Units into the skin daily at 10 pm.  Dispense: 15 mL; Refill: 11 - metFORMIN (GLUCOPHAGE) 1000 MG tablet; Take 1 tablet (1,000 mg total) by mouth 2 (two) times daily with a meal.  Dispense: 180 tablet; Refill: 3 - Urine Microalbumin w/creat. ratio  2. Essential hypertension  - propranolol (INDERAL) 40 MG tablet; Take 1 tablet (40 mg total) by mouth 2 (two) times daily.  Dispense: 180 tablet; Refill: 0  3. Dyslipidemia  obtain FLP, continue on statin  - Lipid panel - atorvastatin (LIPITOR) 40 MG tablet; Take 1 tablet (40 mg total) by mouth at bedtime.  Dispense: 90 tablet; Refill: 1  4. Gastroesophageal reflux disease, esophagitis presence not specified  - omeprazole (PRILOSEC) 20 MG capsule; Take 1 capsule (20 mg total) by mouth daily.  Dispense: 90 capsule; Refill: 0   Ellissa Ayo [facility]ad [facility] 12/19/2016 9:03 AM

## 2016-12-20 LAB — MICROALBUMIN / CREATININE URINE RATIO
Creatinine, Urine: 207 mg/dL (ref 20–320)
MICROALB UR: 6.7 mg/dL
MICROALB/CREAT RATIO: 32 ug/mg{creat} — AB (ref <30)

## 2016-12-25 DIAGNOSIS — M5442 Lumbago with sciatica, left side: Secondary | ICD-10-CM | POA: Diagnosis not present

## 2016-12-25 DIAGNOSIS — M170 Bilateral primary osteoarthritis of knee: Secondary | ICD-10-CM | POA: Diagnosis not present

## 2016-12-25 DIAGNOSIS — Z5181 Encounter for therapeutic drug level monitoring: Secondary | ICD-10-CM | POA: Diagnosis not present

## 2016-12-25 DIAGNOSIS — M47819 Spondylosis without myelopathy or radiculopathy, site unspecified: Secondary | ICD-10-CM | POA: Diagnosis not present

## 2017-01-03 DIAGNOSIS — M170 Bilateral primary osteoarthritis of knee: Secondary | ICD-10-CM | POA: Diagnosis not present

## 2017-01-03 DIAGNOSIS — M25561 Pain in right knee: Secondary | ICD-10-CM | POA: Diagnosis not present

## 2017-01-03 DIAGNOSIS — M25562 Pain in left knee: Secondary | ICD-10-CM | POA: Diagnosis not present

## 2017-01-08 DIAGNOSIS — F329 Major depressive disorder, single episode, unspecified: Secondary | ICD-10-CM | POA: Diagnosis not present

## 2017-03-02 ENCOUNTER — Other Ambulatory Visit: Payer: Self-pay | Admitting: Family Medicine

## 2017-03-02 DIAGNOSIS — R809 Proteinuria, unspecified: Principal | ICD-10-CM

## 2017-03-02 DIAGNOSIS — E1129 Type 2 diabetes mellitus with other diabetic kidney complication: Secondary | ICD-10-CM

## 2017-03-02 DIAGNOSIS — Z794 Long term (current) use of insulin: Principal | ICD-10-CM

## 2017-03-18 ENCOUNTER — Other Ambulatory Visit: Payer: Self-pay | Admitting: Family Medicine

## 2017-03-18 DIAGNOSIS — K219 Gastro-esophageal reflux disease without esophagitis: Secondary | ICD-10-CM

## 2017-03-18 DIAGNOSIS — I10 Essential (primary) hypertension: Secondary | ICD-10-CM

## 2017-03-21 ENCOUNTER — Ambulatory Visit: Payer: Medicare Other | Admitting: Family Medicine

## 2017-03-21 DIAGNOSIS — M7062 Trochanteric bursitis, left hip: Secondary | ICD-10-CM | POA: Diagnosis not present

## 2017-03-21 DIAGNOSIS — M170 Bilateral primary osteoarthritis of knee: Secondary | ICD-10-CM | POA: Diagnosis not present

## 2017-03-21 DIAGNOSIS — M47819 Spondylosis without myelopathy or radiculopathy, site unspecified: Secondary | ICD-10-CM | POA: Diagnosis not present

## 2017-03-21 DIAGNOSIS — F1190 Opioid use, unspecified, uncomplicated: Secondary | ICD-10-CM | POA: Diagnosis not present

## 2017-03-27 ENCOUNTER — Other Ambulatory Visit: Payer: Self-pay | Admitting: Family Medicine

## 2017-03-27 DIAGNOSIS — I10 Essential (primary) hypertension: Secondary | ICD-10-CM

## 2017-03-27 DIAGNOSIS — K219 Gastro-esophageal reflux disease without esophagitis: Secondary | ICD-10-CM

## 2017-03-28 ENCOUNTER — Other Ambulatory Visit: Payer: Self-pay | Admitting: Family Medicine

## 2017-03-28 DIAGNOSIS — K219 Gastro-esophageal reflux disease without esophagitis: Secondary | ICD-10-CM

## 2017-03-28 DIAGNOSIS — I10 Essential (primary) hypertension: Secondary | ICD-10-CM

## 2017-05-14 ENCOUNTER — Encounter: Payer: Self-pay | Admitting: Physician Assistant

## 2017-05-14 ENCOUNTER — Ambulatory Visit: Payer: Medicare Other | Admitting: Physician Assistant

## 2017-05-14 VITALS — BP 132/86 | HR 104 | Temp 98.6°F | Resp 16 | Ht 72.0 in | Wt 257.0 lb

## 2017-05-14 DIAGNOSIS — E1169 Type 2 diabetes mellitus with other specified complication: Secondary | ICD-10-CM

## 2017-05-14 DIAGNOSIS — Z794 Long term (current) use of insulin: Secondary | ICD-10-CM | POA: Diagnosis not present

## 2017-05-14 DIAGNOSIS — I10 Essential (primary) hypertension: Secondary | ICD-10-CM

## 2017-05-14 DIAGNOSIS — E1129 Type 2 diabetes mellitus with other diabetic kidney complication: Secondary | ICD-10-CM

## 2017-05-14 DIAGNOSIS — F329 Major depressive disorder, single episode, unspecified: Secondary | ICD-10-CM

## 2017-05-14 DIAGNOSIS — R809 Proteinuria, unspecified: Secondary | ICD-10-CM | POA: Diagnosis not present

## 2017-05-14 DIAGNOSIS — Q8901 Asplenia (congenital): Secondary | ICD-10-CM | POA: Diagnosis not present

## 2017-05-14 DIAGNOSIS — E782 Mixed hyperlipidemia: Secondary | ICD-10-CM | POA: Diagnosis not present

## 2017-05-14 DIAGNOSIS — G8929 Other chronic pain: Secondary | ICD-10-CM | POA: Diagnosis not present

## 2017-05-14 DIAGNOSIS — F32A Depression, unspecified: Secondary | ICD-10-CM

## 2017-05-14 LAB — POCT GLYCOSYLATED HEMOGLOBIN (HGB A1C): Hemoglobin A1C: 10.9

## 2017-05-14 MED ORDER — LOSARTAN POTASSIUM 50 MG PO TABS: 50.0000 mg | ORAL_TABLET | Freq: Every day | ORAL | 0 refills | Status: DC

## 2017-05-14 NOTE — Patient Instructions (Signed)
Long-Term Care After a Splenectomy A splenectomy is surgery to remove a diseased or injured spleen. The spleen is an organ that is located in the upper left part of the abdomen, just under the ribs. The spleen filters and cleans the blood. It also stores blood cells and destroys cells that are worn out. The spleen is also important for fighting disease. The spleen, along with other body systems and organs, plays an important role in the body's natural defense system (immune system). After your spleen is removed, you have a slightly greater chance of developing a serious, life-threatening infection. The following are some actions that you can take to prevent infection. How can I prevent infection? Your health care provider will recommend actions to help prevent infection. These may include:  Making sure that your immunizations are up-to-date, including: ? Pneumococcus. ? Seasonal flu (influenza). ? Hib (Haemophilus influenzae type b). ? Meningitis.  Making sure that vaccines are up-to-date for your family members.  Following good daily practices to prevent infection, such as: ? Washing your hands often, especially after preparing food, eating, changing diapers, and playing with children or animals. ? Disinfecting surfaces regularly. ? Avoiding people who have active illness or infections.  Taking precautions to avoid insect bites, such as: ? Wearing proper clothing that covers the entire body when you are in wooded or marshy areas. ? Changing clothing right away and checking for bites after you have been outside. ? Using insect spray. ? Using insect netting. ? Staying indoors during hours when mosquitoes are most active.  Taking precautions to avoid dog bites. ? After a splenectomy, you may be at increased risk for rare infections that are associated with dog bites.  What do I need to do if I must travel? If you travel in the [place] take actions to avoid insect bites, especially in  southern and eastern coastal areas. Insects can carry many viruses, and you may be at an increased risk of becoming sick from these viruses. You should also take precautions if you travel abroad to places where malaria is common. In that case, follow these guidelines:  Contact your health care provider to get specific advice about the places that you will be visiting.  Get specific immunizations to guard against the disease risks in the country that you will be visiting.  Understand how to prevent infections, such as malaria, while you are abroad. These infections can pose serious risk. Precautions may include: ? Daily tablets to prevent malaria. ? Taking other precautions to prevent insect bites.  Bring broad-spectrum antibiotic medicines with you if they have been prescribed.  What other things do I need to remember to do?  Take over-the-counter and prescription medicines only as told by your health care provider.  If you were prescribed an antibiotic: ? Take it as told by your health care provider. ? Do not stop taking the antibiotic even if you start to feel better. ? Talk with your health care provider about the use of a probiotic supplement to prevent stomach upset.  Keep track of medicine refills so you do not run out of medicine.  Inform your close contacts of your condition. Consider wearing a medical alert bracelet or carrying an ID card.  Keep all follow-up visits as told by your health care provider. This is important. When should I seek medical care? Call your health care provider if:  You have a fever.  You have signs of infection that continue after taking an antibiotic. Signs may include   chills and feeling unwell.  You are considering travel abroad.  You are bitten by a tick or a dog.  When should I seek immediate medical care? Call 911 or go to the emergency department if:  You have chest pain along with: ? Shortness of breath. ? Pain in the back, neck, or  jaw.  You have pain or swelling in the leg.  You develop a sudden headache and dizziness.  This information is not intended to replace advice given to you by your health care provider. Make sure you discuss any questions you have with your health care provider. Document Released: 07/26/2009 Document Revised: 07/11/2015 Document Reviewed: 10/19/2014 Elsevier Interactive Patient Education  2018 [employer]  
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
## 2017-05-15 ENCOUNTER — Other Ambulatory Visit: Payer: Self-pay

## 2017-05-15 ENCOUNTER — Telehealth: Payer: Self-pay

## 2017-05-15 LAB — COMPREHENSIVE METABOLIC PANEL
ALT: 49 IU/L — ABNORMAL HIGH (ref 0–44)
AST: 40 IU/L (ref 0–40)
Albumin/Globulin Ratio: 1.4 (ref 1.2–2.2)
Albumin: 4.7 g/dL (ref 3.6–4.8)
Alkaline Phosphatase: 80 IU/L (ref 39–117)
BUN/Creatinine Ratio: 17 (ref 10–24)
BUN: 18 mg/dL (ref 8–27)
Bilirubin Total: 0.4 mg/dL (ref 0.0–1.2)
CO2: 22 mmol/L (ref 20–29)
Calcium: 10.3 mg/dL — ABNORMAL HIGH (ref 8.6–10.2)
Chloride: 96 mmol/L (ref 96–106)
Creatinine, Ser: 1.08 mg/dL (ref 0.76–1.27)
GFR calc Af Amer: 85 mL/min/{1.73_m2} (ref >59)
GFR calc non Af Amer: 74 mL/min/{1.73_m2} (ref >59)
Globulin, Total: 3.4 g/dL (ref 1.5–4.5)
Glucose: 210 mg/dL — ABNORMAL HIGH (ref 65–99)
Potassium: 4.3 mmol/L (ref 3.5–5.2)
Sodium: 137 mmol/L (ref 134–144)
Total Protein: 8.1 g/dL (ref 6.0–8.5)

## 2017-05-15 MED ORDER — ALBUTEROL SULFATE HFA 108 (90 BASE) MCG/ACT IN AERS: 2.0000 | INHALATION_SPRAY | RESPIRATORY_TRACT | 5 refills | Status: AC | PRN

## 2017-05-15 NOTE — Telephone Encounter (Signed)
Pt advised.   Thanks,   -Laura  

## 2017-05-15 NOTE — Telephone Encounter (Signed)
-----   Message from Trey SailorsAdriana M Pollak, [place]PA-C sent at 05/15/2017  9:06 AM EDT ----- Sugar is high which we expected from A1C. Otherwise labs are normal and we will see him in follow up after he has consistently done insulin injections. I do anticipate having to adjust his regimen at that point.

## 2017-05-20 ENCOUNTER — Other Ambulatory Visit: Payer: Self-pay

## 2017-05-20 DIAGNOSIS — K219 Gastro-esophageal reflux disease without esophagitis: Secondary | ICD-10-CM

## 2017-06-18 ENCOUNTER — Ambulatory Visit: Payer: Medicare Other | Admitting: Physician Assistant

## 2017-06-18 DIAGNOSIS — Z79891 Long term (current) use of opiate analgesic: Secondary | ICD-10-CM | POA: Diagnosis not present

## 2017-06-18 DIAGNOSIS — M5442 Lumbago with sciatica, left side: Secondary | ICD-10-CM | POA: Diagnosis not present

## 2017-06-18 DIAGNOSIS — M25561 Pain in right knee: Secondary | ICD-10-CM | POA: Diagnosis not present

## 2017-06-18 DIAGNOSIS — Z5181 Encounter for therapeutic drug level monitoring: Secondary | ICD-10-CM | POA: Diagnosis not present

## 2017-06-27 DIAGNOSIS — G8929 Other chronic pain: Secondary | ICD-10-CM | POA: Diagnosis not present

## 2017-06-27 DIAGNOSIS — M47816 Spondylosis without myelopathy or radiculopathy, lumbar region: Secondary | ICD-10-CM | POA: Diagnosis not present

## 2017-07-02 ENCOUNTER — Encounter: Payer: Self-pay | Admitting: Physician Assistant

## 2017-07-02 ENCOUNTER — Ambulatory Visit: Payer: Medicare Other | Admitting: Physician Assistant

## 2017-07-02 VITALS — BP 134/84 | HR 96 | Temp 98.1°F | Resp 16 | Wt 251.0 lb

## 2017-07-02 DIAGNOSIS — K219 Gastro-esophageal reflux disease without esophagitis: Secondary | ICD-10-CM | POA: Diagnosis not present

## 2017-07-02 DIAGNOSIS — R809 Proteinuria, unspecified: Secondary | ICD-10-CM

## 2017-07-02 DIAGNOSIS — I10 Essential (primary) hypertension: Secondary | ICD-10-CM

## 2017-07-02 DIAGNOSIS — Z794 Long term (current) use of insulin: Secondary | ICD-10-CM | POA: Diagnosis not present

## 2017-07-02 DIAGNOSIS — E1129 Type 2 diabetes mellitus with other diabetic kidney complication: Secondary | ICD-10-CM | POA: Diagnosis not present

## 2017-07-02 MED ORDER — OMEPRAZOLE 20 MG PO CPDR: 20.0000 mg | DELAYED_RELEASE_CAPSULE | Freq: Every day | ORAL | 1 refills | Status: DC

## 2017-07-02 NOTE — Progress Notes (Signed)
Patient: Alan George Male    DOB: [birth year]   62 y.o.   MRN: [id number] Visit Date: 07/05/2017  Today's Provider: Trey Sailors, PA-C   Chief Complaint  Patient presents with  . Diabetes    One month follow up   Subjective:    Alan George is a 62 y/o man presenting today for follow up of Diabetes. Last A1c was 10.9. Was not taking Lantus consistently and now he is. Also takes metformin and glipizide.    Last visit, he was changed from Lisinopril to Losartan due to cough. He reports he is doing well with this, denies cough.   Diabetes  He presents for his follow-up diabetic visit. He has type 2 diabetes mellitus. His disease course has been improving. There are no hypoglycemic associated symptoms. There are no diabetic associated symptoms. Pertinent negatives for diabetes include no polydipsia, no polyphagia and no polyuria. Symptoms are improving. He is compliant with treatment all of the time. His weight is stable. He is following a diabetic diet. He rarely participates in exercise. His home blood glucose trend is decreasing steadily (Fasting blood sugars are usualy around low to mid 100's. ). An ACE inhibitor/angiotensin II receptor blocker is being taken. He does not see a podiatrist.Eye exam is current.   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    Allergies  Allergen Reactions  . Dog Epithelium   . Dust Mite Extract   . Pollen Extract   . Tree Extract   . Iodinated Diagnostic Agents Hives    ?gadolinium-thinks it was after MRI  . Other Rash     Current Outpatient Medications:  .  albuterol (PROAIR HFA) 108 (90 Base) MCG/ACT inhaler, Inhale 2 puffs into the lungs every 4 (four) hours as needed for wheezing or shortness of breath., Disp: 1 Inhaler, Rfl: 5 .  atorvastatin (LIPITOR) 40 MG tablet, Take 1 tablet (40 mg total) by mouth at bedtime., Disp: 90 tablet, Rfl: 1 .  cyclobenzaprine (FLEXERIL) 10 MG tablet, Take by mouth., Disp: , Rfl:  .   glipiZIDE (GLUCOTROL) 5 MG tablet, Take 1 tablet (5 mg total) by mouth 2 (two) times daily before a meal., Disp: 180 tablet, Rfl: 1 .  HYDROmorphone (DILAUDID) 4 MG tablet, take 0.5 tablet by mouth every 4 hours if needed, Disp: , Rfl: 0 .  Insulin Glargine (LANTUS SOLOSTAR) 100 UNIT/ML Solostar Pen, Inject 25 Units into the skin daily at 10 pm., Disp: 15 mL, Rfl: 11 .  losartan (COZAAR) 50 MG tablet, Take 1 tablet (50 mg total) by mouth daily., Disp: 90 tablet, Rfl: 0 .  Melatonin 1 MG TABS, Take by mouth., Disp: , Rfl:  .  metFORMIN (GLUCOPHAGE) 1000 MG tablet, Take 1 tablet (1,000 mg total) by mouth 2 (two) times daily with a meal., Disp: 180 tablet, Rfl: 3 .  mirtazapine (REMERON) 30 MG tablet, Take 30 mg by mouth at bedtime., Disp: , Rfl: 0 .  omeprazole (PRILOSEC) 20 MG capsule, Take 1 capsule (20 mg total) by mouth daily., Disp: 90 capsule, Rfl: 1 .  sertraline (ZOLOFT) 100 MG tablet, Take 200 mg by mouth daily., Disp: , Rfl: 0 .  Vitamin D, Ergocalciferol, (DRISDOL) 50000 UNITS CAPS capsule, Take by mouth., Disp: , Rfl:   Review of Systems  Constitutional: Negative.   Respiratory: Negative.   Cardiovascular: Negative.   Gastrointestinal: Negative.   Endocrine: Negative.  Negative for polydipsia, polyphagia and polyuria.  Musculoskeletal: Negative.  Social History   Tobacco Use  . Smoking status: Former Games developer  . Smokeless tobacco: Never Used  Substance Use Topics  . Alcohol use: No    Alcohol/week: 0.0 oz   Objective:   BP 134/84 (BP Location: Right Arm, Patient Position: Sitting, Cuff Size: Normal)   Pulse 96   Temp 98.1 F (36.7 C) (Oral)   Resp 16   Wt 251 lb (113.9 kg)   SpO2 97%   BMI 34.04 kg/m  Vitals:   07/02/17 0807  BP: 134/84  Pulse: 96  Resp: 16  Temp: 98.1 F (36.7 C)  TempSrc: Oral  SpO2: 97%  Weight: 251 lb (113.9 kg)     Physical Exam  Constitutional: He is oriented to person, place, and time. He appears well-developed and  well-nourished.  Cardiovascular: Normal rate and regular rhythm.  Pulmonary/Chest: Effort normal and breath sounds normal.  Neurological: He is alert and oriented to person, place, and time.  Skin: Skin is warm and dry.  Psychiatric: He has a normal mood and affect. His behavior is normal.        Assessment & Plan:     1. Type 2 diabetes mellitus with microalbuminuria, with long-term current use of insulin (HCC)  Fasting sugars declined. Will check A1c at next visit. Continue current medications.  2. Gastroesophageal reflux disease, esophagitis presence not specified  Refilled.  - omeprazole (PRILOSEC) 20 MG capsule; Take 1 capsule (20 mg total) by mouth daily.  Dispense: 90 capsule; Refill: 1  3. Essential hypertension  Tolerating Losartan well, continue this medication.  Return in about 6 weeks (around 08/13/2017) for DM.  The entirety of the information documented in the History of Present Illness, Review of Systems and Physical Exam were personally obtained by me. Portions of this information were initially documented by Kavin Leech, CMA and reviewed by me for thoroughness and accuracy.         Trey Sailors, PA-C  [facility]

## 2017-07-05 NOTE — Patient Instructions (Signed)

## 2017-07-11 DIAGNOSIS — F329 Major depressive disorder, single episode, unspecified: Secondary | ICD-10-CM | POA: Diagnosis not present

## 2017-08-04 ENCOUNTER — Other Ambulatory Visit: Payer: Self-pay | Admitting: Physician Assistant

## 2017-08-04 DIAGNOSIS — Z794 Long term (current) use of insulin: Secondary | ICD-10-CM

## 2017-08-04 DIAGNOSIS — E1129 Type 2 diabetes mellitus with other diabetic kidney complication: Secondary | ICD-10-CM

## 2017-08-04 DIAGNOSIS — I10 Essential (primary) hypertension: Secondary | ICD-10-CM

## 2017-08-04 DIAGNOSIS — R809 Proteinuria, unspecified: Secondary | ICD-10-CM

## 2017-08-05 MED ORDER — LOSARTAN POTASSIUM 50 MG PO TABS: 50.0000 mg | ORAL_TABLET | Freq: Every day | ORAL | 0 refills | Status: DC

## 2017-08-28 ENCOUNTER — Telehealth: Payer: Self-pay

## 2017-08-28 ENCOUNTER — Ambulatory Visit
Admission: RE | Admit: 2017-08-28 | Discharge: 2017-08-28 | Disposition: A | Discharge status: Home / Self Care | Payer: Medicare Other | Source: Ambulatory Visit | Attending: Physician Assistant | Admitting: Physician Assistant

## 2017-08-28 ENCOUNTER — Ambulatory Visit (INDEPENDENT_AMBULATORY_CARE_PROVIDER_SITE_OTHER): Payer: Medicare Other | Admitting: Physician Assistant

## 2017-08-28 ENCOUNTER — Encounter: Payer: Self-pay | Admitting: Physician Assistant

## 2017-08-28 VITALS — BP 136/84 | HR 108 | Temp 99.0°F | Resp 16 | Wt 247.0 lb

## 2017-08-28 DIAGNOSIS — T17308A Unspecified foreign body in larynx causing other injury, initial encounter: Secondary | ICD-10-CM | POA: Diagnosis not present

## 2017-08-28 DIAGNOSIS — E1129 Type 2 diabetes mellitus with other diabetic kidney complication: Secondary | ICD-10-CM | POA: Diagnosis not present

## 2017-08-28 DIAGNOSIS — R059 Cough, unspecified: Secondary | ICD-10-CM

## 2017-08-28 DIAGNOSIS — R809 Proteinuria, unspecified: Secondary | ICD-10-CM | POA: Diagnosis not present

## 2017-08-28 DIAGNOSIS — Z794 Long term (current) use of insulin: Secondary | ICD-10-CM

## 2017-08-28 DIAGNOSIS — R05 Cough: Secondary | ICD-10-CM

## 2017-08-28 DIAGNOSIS — I10 Essential (primary) hypertension: Secondary | ICD-10-CM | POA: Diagnosis not present

## 2017-08-28 MED ORDER — FLUTICASONE FUROATE-VILANTEROL 100-25 MCG/INH IN AEPB: 1.0000 | INHALATION_SPRAY | Freq: Every day | RESPIRATORY_TRACT | 1 refills | Status: DC

## 2017-08-28 NOTE — Telephone Encounter (Signed)
-----   Message from Trey SailorsAdriana M Pollak, [place]PA-C sent at 08/28/2017 10:45 AM EDT ----- No pneumonia on Xray, start Breo. Plan on pulmonary function studies next time.

## 2017-08-28 NOTE — Patient Instructions (Addendum)
Inject 27 units of Lantus at 10 pm nightly.    Diabetes Mellitus and Exercise Exercising regularly is important for your overall health, especially when you have diabetes (diabetes mellitus). Exercising is not only about losing weight. It has many health benefits, such as increasing muscle strength and bone density and reducing body fat and stress. This leads to improved fitness, flexibility, and endurance, all of which result in better overall health. Exercise has additional benefits for people with diabetes, including:  Reducing appetite.  Helping to lower and control blood glucose.  Lowering blood pressure.  Helping to control amounts of fatty substances (lipids) in the blood, such as cholesterol and triglycerides.  Helping the body to respond better to insulin (improving insulin sensitivity).  Reducing how much insulin the body needs.  Decreasing the risk for heart disease by: ? Lowering cholesterol and triglyceride levels. ? Increasing the levels of good cholesterol. ? Lowering blood glucose levels.  What is my activity plan? Your health care provider or certified diabetes educator can help you make a plan for the type and frequency of exercise (activity plan) that works for you. Make sure that you:  Do at least 150 minutes of moderate-intensity or vigorous-intensity exercise each week. This could be brisk walking, biking, or water aerobics. ? Do stretching and strength exercises, such as yoga or weightlifting, at least 2 times a week. ? Spread out your activity over at least 3 days of the week.  Get some form of physical activity every day. ? Do not go more than 2 days in a row without some kind of physical activity. ? Avoid being inactive for more than 90 minutes at a time. Take frequent breaks to walk or stretch.  Choose a type of exercise or activity that you enjoy, and set realistic goals.  Start slowly, and gradually increase the intensity of your exercise over  time.  What do I need to know about managing my diabetes?  Check your blood glucose before and after exercising. ? If your blood glucose is higher than 240 mg/dL (96.013.3 mmol/L) before you exercise, check your urine for ketones. If you have ketones in your urine, do not exercise until your blood glucose returns to normal.  Know the symptoms of low blood glucose (hypoglycemia) and how to treat it. Your risk for hypoglycemia increases during and after exercise. Common symptoms of hypoglycemia can include: ? Hunger. ? Anxiety. ? Sweating and feeling clammy. ? Confusion. ? Dizziness or feeling light-headed. ? Increased heart rate or palpitations. ? Blurry vision. ? Tingling or numbness around the mouth, lips, or tongue. ? Tremors or shakes. ? Irritability.  Keep a rapid-acting carbohydrate snack available before, during, and after exercise to help prevent or treat hypoglycemia.  Avoid injecting insulin into areas of the body that are going to be exercised. For example, avoid injecting insulin into: ? The arms, when playing tennis. ? The legs, when jogging.  Keep records of your exercise habits. Doing this can help you and your health care provider adjust your diabetes management plan as needed. Write down: ? Food that you eat before and after you exercise. ? Blood glucose levels before and after you exercise. ? The type and amount of exercise you have done. ? When your insulin is expected to peak, if you use insulin. Avoid exercising at times when your insulin is peaking.  When you start a new exercise or activity, work with your health care provider to make sure the activity is safe for you,  and to adjust your insulin, medicines, or food intake as needed.  Drink plenty of water while you exercise to prevent dehydration or heat stroke. Drink enough fluid to keep your urine clear or pale yellow. This information is not intended to replace advice given to you by your health care provider.  Make sure you discuss any questions you have with your health care provider. Document Released: 04/28/2003 Document Revised: 08/26/2015 Document Reviewed: 07/18/2015 Elsevier Interactive Patient Education  2018 [employer].

## 2017-08-28 NOTE — Progress Notes (Signed)
Patient: Alan George Male    DOB: [birth year]   62 y.o.   MRN: [id number] Visit Date: 08/28/2017  Today's Provider: Trey Sailors, PA-C   Chief Complaint  Patient presents with  . Diabetes  . Hypertension  . Hyperlipidemia   Subjective:   A1c today is 7.1% down from 10.6% last time. Patient has been compliant with insulin and other oral diabetes regimen. Continues to eat cookies and peanut butter crackers. Currently on 25 units Lantus nightly with fasting glucose 115-170.   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 Continues to decline repeat vaccinations including pneumonia and meningitis.   Reports wheezing, albuterol helps some but not all. 30 pack year smoking history says he has never been dx with COPD but has had breo before which helped.   Also reports that he chokes up undigested food. He says previously when he was hospitalized he was told not to be careless or food would go down his windpipe. He says he will eat a meal and then choke, go to bed, and cough up undigested food. Declines referral to GI for endoscopy and swallow study. He says he thinks it is going into hs airway.   Diabetes  He presents for his follow-up diabetic visit. He has type 2 diabetes mellitus. There are no hypoglycemic associated symptoms. Pertinent negatives for hypoglycemia include no headaches or sweats. Pertinent negatives for diabetes include no blurred vision, no chest pain, no fatigue, no foot paresthesias, no foot ulcerations, no polydipsia, no polyphagia, no polyuria and no visual change. There are no hypoglycemic complications. There are no diabetic complications. Risk factors for coronary artery disease include dyslipidemia, diabetes mellitus and hypertension. Current diabetic treatment includes oral agent (triple therapy). He is compliant with treatment all of the time. He is following a generally healthy diet. (Fasting blood sugar are in the 100's. ) An ACE inhibitor/angiotensin II  receptor blocker is being taken. He does not see a podiatrist.Eye exam is current.  Hypertension  This is a chronic problem. The problem is unchanged. The problem is controlled. Associated symptoms include shortness of breath. Pertinent negatives include no anxiety, blurred vision, chest pain, headaches, malaise/fatigue, neck pain, orthopnea, palpitations, peripheral edema or sweats. There are no associated agents to hypertension. Risk factors for coronary artery disease include diabetes mellitus, dyslipidemia, male gender and obesity. There are no compliance problems.   Hyperlipidemia  This is a chronic problem. The problem is controlled. Recent lipid tests were reviewed and are normal. Associated symptoms include shortness of breath. Pertinent negatives include no chest pain, focal sensory loss, focal weakness, leg pain or myalgias. Current antihyperlipidemic treatment includes statins. There are no compliance problems.        Allergies  Allergen Reactions  . Dog Epithelium   . Dust Mite Extract   . Pollen Extract   . Tree Extract   . Iodinated Diagnostic Agents Hives    ?gadolinium-thinks it was after MRI  . Other Rash     Current Outpatient Medications:  .  albuterol (PROAIR HFA) 108 (90 Base) MCG/ACT inhaler, Inhale 2 puffs into the lungs every 4 (four) hours as needed for wheezing or shortness of breath., Disp: 1 Inhaler, Rfl: 5 .  atorvastatin (LIPITOR) 40 MG tablet, Take 1 tablet (40 mg total) by mouth at bedtime., Disp: 90 tablet, Rfl: 1 .  cyclobenzaprine (FLEXERIL) 10 MG tablet, Take by mouth., Disp: , Rfl:  .  glipiZIDE (GLUCOTROL) 5 MG tablet, Take 1 tablet (5 mg total)  by mouth 2 (two) times daily before a meal., Disp: 180 tablet, Rfl: 1 .  HYDROmorphone (DILAUDID) 4 MG tablet, take 0.5 tablet by mouth every 4 hours if needed, Disp: , Rfl: 0 .  Insulin Glargine (LANTUS SOLOSTAR) 100 UNIT/ML Solostar Pen, Inject 25 Units into the skin daily at 10 pm., Disp: 15 mL, Rfl: 11 .   losartan (COZAAR) 50 MG tablet, Take 1 tablet (50 mg total) by mouth daily., Disp: 90 tablet, Rfl: 0 .  Melatonin 1 MG TABS, Take by mouth., Disp: , Rfl:  .  metFORMIN (GLUCOPHAGE) 1000 MG tablet, Take 1 tablet (1,000 mg total) by mouth 2 (two) times daily with a meal., Disp: 180 tablet, Rfl: 3 .  mirtazapine (REMERON) 30 MG tablet, Take 30 mg by mouth at bedtime., Disp: , Rfl: 0 .  omeprazole (PRILOSEC) 20 MG capsule, Take 1 capsule (20 mg total) by mouth daily., Disp: 90 capsule, Rfl: 1 .  sertraline (ZOLOFT) 100 MG tablet, Take 200 mg by mouth daily., Disp: , Rfl: 0 .  fluticasone furoate-vilanterol (BREO ELLIPTA) 100-25 MCG/INH AEPB, Inhale 1 puff into the lungs daily., Disp: 60 each, Rfl: 1 .  Vitamin D, Ergocalciferol, (DRISDOL) 50000 UNITS CAPS capsule, Take by mouth., Disp: , Rfl:   Review of Systems  Constitutional: Negative.  Negative for fatigue and malaise/fatigue.  HENT: Positive for trouble swallowing. Negative for sore throat.   Eyes: Negative for blurred vision.  Respiratory: Positive for cough, shortness of breath and wheezing. Negative for apnea, choking, chest tightness and stridor.   Cardiovascular: Negative for chest pain, palpitations, orthopnea and leg swelling.  Gastrointestinal: Negative.   Endocrine: Negative for polydipsia, polyphagia and polyuria.  Musculoskeletal: Negative for myalgias and neck pain.  Neurological: Negative for focal weakness, light-headedness and headaches.    Social History   Tobacco Use  . Smoking status: Former Games developer  . Smokeless tobacco: Never Used  Substance Use Topics  . Alcohol use: No    Alcohol/week: 0.0 oz   Objective:   BP 136/84 (BP Location: Left Arm, Patient Position: Sitting, Cuff Size: Large)   Pulse (!) 108   Temp 99 F (37.2 C) (Oral)   Resp 16   Wt 247 lb (112 kg)   SpO2 93%   BMI 33.50 kg/m  Vitals:   08/28/17 0811  BP: 136/84  Pulse: (!) 108  Resp: 16  Temp: 99 F (37.2 C)  TempSrc: Oral  SpO2: 93%    Weight: 247 lb (112 kg)     Physical Exam  Constitutional: He is oriented to person, place, and time. He appears well-developed and well-nourished.  Cardiovascular: Normal rate and regular rhythm.  Pulmonary/Chest: Effort normal and breath sounds normal.  Neurological: He is alert and oriented to person, place, and time.  Skin: Skin is warm.  Psychiatric: He has a normal mood and affect. His behavior is normal.        Assessment & Plan:     1. Type 2 diabetes mellitus with microalbuminuria, with long-term current use of insulin (HCC)  DM II much improved. Will increase basal insulin to 27 units nightly as patient continues to eat cookies and peanut butter crackers.   2. Essential hypertension  Controlled on Losartan. Continue.   3. Cough  Could be due to COPD, may have element of aspiration or aspiration pneumonia. Will get CXR. Trial of breo but he should plan on pulmonary function studies next visit.   - DG Chest 2 View; Future - fluticasone furoate-vilanterol (BREO  ELLIPTA) 100-25 MCG/INH AEPB; Inhale 1 puff into the lungs daily.  Dispense: 60 each; Refill: 1  4. 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 It is possible he could be aspirating. However, what he describes sounds more like he is regurgitating undigested food as I find it very hard to believe that he would have food lodged in his windpipe the entirety of the night that he would then cough up the next morning. He says he does not want to go to GI for swallow study or endoscopy. He says he will wait and see what happens.  Return in about 3 months (around 11/28/2017) for DM II.  The entirety of the information documented in the History of Present Illness, Review of Systems and Physical Exam were personally obtained by me. Portions of this information were initially documented by Kavin LeechLaura Walsh, CMA and reviewed by me for thoroughness and accuracy.          Trey SailorsAdriana M Dontae Minerva, PA-C  [facility]

## 2017-08-28 NOTE — Telephone Encounter (Signed)
LMTCB 08/28/2017   Thanks,   -Laura  

## 2017-08-29 NOTE — Telephone Encounter (Signed)
Mrs. Alan George advised. (On DPR)   Thanks,   -Vernona RiegerLaura

## 2017-09-17 DIAGNOSIS — M25561 Pain in right knee: Secondary | ICD-10-CM | POA: Diagnosis not present

## 2017-09-17 DIAGNOSIS — M7062 Trochanteric bursitis, left hip: Secondary | ICD-10-CM | POA: Diagnosis not present

## 2017-09-17 DIAGNOSIS — M5442 Lumbago with sciatica, left side: Secondary | ICD-10-CM | POA: Diagnosis not present

## 2017-09-17 DIAGNOSIS — Z5181 Encounter for therapeutic drug level monitoring: Secondary | ICD-10-CM | POA: Diagnosis not present

## 2017-10-23 ENCOUNTER — Other Ambulatory Visit: Payer: Self-pay | Admitting: Physician Assistant

## 2017-10-23 DIAGNOSIS — R05 Cough: Secondary | ICD-10-CM

## 2017-10-23 DIAGNOSIS — R059 Cough, unspecified: Secondary | ICD-10-CM

## 2017-11-03 ENCOUNTER — Other Ambulatory Visit: Payer: Self-pay | Admitting: Physician Assistant

## 2017-11-03 DIAGNOSIS — R809 Proteinuria, unspecified: Secondary | ICD-10-CM

## 2017-11-03 DIAGNOSIS — I10 Essential (primary) hypertension: Secondary | ICD-10-CM

## 2017-11-03 DIAGNOSIS — E1129 Type 2 diabetes mellitus with other diabetic kidney complication: Secondary | ICD-10-CM

## 2017-11-03 DIAGNOSIS — Z794 Long term (current) use of insulin: Secondary | ICD-10-CM

## 2017-11-28 DIAGNOSIS — M47818 Spondylosis without myelopathy or radiculopathy, sacral and sacrococcygeal region: Secondary | ICD-10-CM | POA: Diagnosis not present

## 2017-11-28 DIAGNOSIS — M533 Sacrococcygeal disorders, not elsewhere classified: Secondary | ICD-10-CM | POA: Diagnosis not present

## 2017-11-29 ENCOUNTER — Ambulatory Visit: Payer: Medicare Other | Admitting: Physician Assistant

## 2017-12-05 ENCOUNTER — Encounter: Payer: Self-pay | Admitting: Physician Assistant

## 2017-12-05 ENCOUNTER — Ambulatory Visit: Payer: Medicare Other | Admitting: Physician Assistant

## 2017-12-05 VITALS — BP 130/90 | HR 88 | Temp 98.3°F | Resp 16 | Ht 72.0 in | Wt 253.0 lb

## 2017-12-05 DIAGNOSIS — E1129 Type 2 diabetes mellitus with other diabetic kidney complication: Secondary | ICD-10-CM | POA: Diagnosis not present

## 2017-12-05 DIAGNOSIS — R809 Proteinuria, unspecified: Secondary | ICD-10-CM

## 2017-12-05 DIAGNOSIS — J45909 Unspecified asthma, uncomplicated: Secondary | ICD-10-CM | POA: Diagnosis not present

## 2017-12-05 DIAGNOSIS — I10 Essential (primary) hypertension: Secondary | ICD-10-CM

## 2017-12-05 DIAGNOSIS — E785 Hyperlipidemia, unspecified: Secondary | ICD-10-CM

## 2017-12-05 DIAGNOSIS — E1169 Type 2 diabetes mellitus with other specified complication: Secondary | ICD-10-CM

## 2017-12-05 DIAGNOSIS — Z794 Long term (current) use of insulin: Secondary | ICD-10-CM | POA: Diagnosis not present

## 2017-12-05 DIAGNOSIS — Z114 Encounter for screening for human immunodeficiency virus [HIV]: Secondary | ICD-10-CM | POA: Diagnosis not present

## 2017-12-05 DIAGNOSIS — Z1159 Encounter for screening for other viral diseases: Secondary | ICD-10-CM

## 2017-12-05 LAB — POCT GLYCOSYLATED HEMOGLOBIN (HGB A1C)
Est. average glucose Bld gHb Est-mCnc: 177
Hemoglobin A1C: 7.8 % — AB (ref 4.0–5.6)

## 2017-12-05 NOTE — Progress Notes (Signed)
Patient: Alan George Male    DOB: 12-08-1955   62 y.o.   MRN: [id number] Visit Date: 12/06/2017  Today's Provider: Trey Sailors, PA-C   Chief Complaint  Patient presents with  . Diabetes  . Follow-up    cough   Subjective:    HPI  Diabetes Mellitus Type II, Follow-up:   Lab Results  Component Value Date   HGBA1C 7.8 (A) 12/05/2017   HGBA1C 10.9 05/14/2017   HGBA1C 8.2 12/19/2016    Last seen for diabetes 3 months ago.  Management since then includes no chagnes. He reports excellent compliance with treatment. He is not having side effects.  Current symptoms include none and have been stable. Home blood sugar records: fasting range: 120's  Episodes of hypoglycemia? no   Current Insulin Regimen:  Most Recent Eye Exam: 03/2018 [facility] Weight trend: stable Prior visit with dietician: no Current diet: in general, a "healthy" diet  He has made some dietary changes including reducing cookies and breads. Sticks to diet sodas.  Current exercise: none  Pertinent Labs:    Component Value Date/Time   CHOL 214 (H) 12/05/2017 0939   TRIG 143 12/05/2017 0939   HDL 51 12/05/2017 0939   LDLCALC 134 (H) 12/05/2017 0939   LDLCALC 68 12/19/2016 0950   CREATININE 0.91 12/05/2017 0939   CREATININE 1.02 07/03/2016 0938    Wt Readings from Last 3 Encounters:  12/05/17 253 lb (114.8 kg)  08/28/17 247 lb (112 kg)  07/02/17 251 lb (113.9 kg)    ------------------------------------------------------------------------  Follow up for cough  The patient was last seen for this 3 months ago. Changes made at last visit include start Breo.  He reports excellent compliance with treatment. He feels that condition is Improved. He feels he is able to breathe much better with this. He reports taking it every day and continues to wash his mouth out after use.  He is not having side effects.  Reports when he was a child had persistent issues with asthma, including exercise  induced asthma and worsening of symptoms during cold weather.   ------------------------------------------------------------------------------------  Asplenia: He continues to decline all routine vaccinations indicated for asplenic patients.    Allergies  Allergen Reactions  . Dog Epithelium   . Dust Mite Extract   . Pollen Extract   . Tree Extract   . Iodinated Diagnostic Agents Hives    ?gadolinium-thinks it was after MRI  . Other Rash     Current Outpatient Medications:  .  albuterol (PROAIR HFA) 108 (90 Base) MCG/ACT inhaler, Inhale 2 puffs into the lungs every 4 (four) hours as needed for wheezing or shortness of breath., Disp: 1 Inhaler, Rfl: 5 .  BREO ELLIPTA 100-25 MCG/INH AEPB, INHALE 1 PUFF INTO THE LUNGS DAILY, Disp: 60 each, Rfl: 1 .  cyclobenzaprine (FLEXERIL) 10 MG tablet, Take by mouth., Disp: , Rfl:  .  glipiZIDE (GLUCOTROL) 5 MG tablet, Take 1 tablet (5 mg total) by mouth 2 (two) times daily before a meal., Disp: 180 tablet, Rfl: 1 .  HYDROmorphone (DILAUDID) 4 MG tablet, take 0.5 tablet by mouth every 4 hours if needed, Disp: , Rfl: 0 .  Insulin Glargine (LANTUS SOLOSTAR) 100 UNIT/ML Solostar Pen, Inject 25 Units into the skin daily at 10 pm., Disp: 15 mL, Rfl: 11 .  losartan (COZAAR) 50 MG tablet, TAKE 1 TABLET(50 MG) BY MOUTH DAILY, Disp: 90 tablet, Rfl: 0 .  Melatonin 1 MG TABS, Take by mouth.,  Disp: , Rfl:  .  metFORMIN (GLUCOPHAGE) 1000 MG tablet, Take 1 tablet (1,000 mg total) by mouth 2 (two) times daily with a meal., Disp: 180 tablet, Rfl: 3 .  mirtazapine (REMERON) 30 MG tablet, Take 30 mg by mouth at bedtime., Disp: , Rfl: 0 .  omeprazole (PRILOSEC) 20 MG capsule, Take 1 capsule (20 mg total) by mouth daily., Disp: 90 capsule, Rfl: 1 .  sertraline (ZOLOFT) 100 MG tablet, Take 200 mg by mouth daily., Disp: , Rfl: 0 .  Vitamin D, Ergocalciferol, (DRISDOL) 50000 UNITS CAPS capsule, Take by mouth., Disp: , Rfl:  .  atorvastatin (LIPITOR) 80 MG tablet, Take 1  tablet (80 mg total) by mouth daily., Disp: 90 tablet, Rfl: 0  Review of Systems  Constitutional: Negative.   Respiratory: Positive for cough.   Cardiovascular: Negative.   Endocrine: Negative.     Social History   Tobacco Use  . Smoking status: Former Games developer  . Smokeless tobacco: Never Used  Substance Use Topics  . Alcohol use: No    Alcohol/week: 0.0 standard drinks   Objective:   BP 130/90 (BP Location: Left Arm, Patient Position: Sitting, Cuff Size: Large)   Pulse 88   Temp 98.3 F (36.8 C) (Oral)   Resp 16   Ht 6' (1.829 m)   Wt 253 lb (114.8 kg)   SpO2 94%   BMI 34.31 kg/m  Vitals:   12/05/17 0843  BP: 130/90  Pulse: 88  Resp: 16  Temp: 98.3 F (36.8 C)  TempSrc: Oral  SpO2: 94%  Weight: 253 lb (114.8 kg)  Height: 6' (1.829 m)     Physical Exam  Constitutional: He is oriented to person, place, and time. He appears well-developed and well-nourished.  Cardiovascular: Normal rate and regular rhythm.  Pulmonary/Chest: Effort normal and breath sounds normal.  Abdominal: Soft. Bowel sounds are normal.  Neurological: He is alert and oriented to person, place, and time.  Skin: Skin is warm and dry.  Psychiatric: He has a normal mood and affect. His behavior is normal.        Assessment & Plan:     1. Asthma, unspecified asthma severity, unspecified whether complicated, unspecified whether persistent  Spirometry today does not indicate COPD, suspect more asthma. Continue Breo.  - Spirometry with Graph  2. Type 2 diabetes mellitus with microalbuminuria, with long-term current use of insulin (HCC)  A1C has increased slightly though patient has made positive changes in diet. Will recheck next time.   - POCT glycosylated hemoglobin (Hb A1C) - Comprehensive Metabolic Panel (CMET) - Lipid Profile - CBC With Differential  3. Encounter for screening for HIV  - HIV antibody (with reflex)  4. Encounter for hepatitis C screening test for low risk  patient  - Hepatitis C antibody  5. Essential hypertension  Controlled, continue losartan 50 mg.   6. Hyperlipidemia associated with type 2 diabetes mellitus (HCC)  Check labwork and adjust lipitor 40 mg accordingly.  Return in about 3 months (around 03/07/2018) for DM, HTN, HLD.  The entirety of the information documented in the History of Present Illness, Review of Systems and Physical Exam were personally obtained by me. Portions of this information were initially documented by Rondel Baton, CMA and reviewed by me for thoroughness and accuracy.           Trey Sailors, PA-C  [facility]

## 2017-12-06 ENCOUNTER — Other Ambulatory Visit: Payer: Self-pay | Admitting: Physician Assistant

## 2017-12-06 ENCOUNTER — Telehealth: Payer: Self-pay

## 2017-12-06 DIAGNOSIS — E785 Hyperlipidemia, unspecified: Secondary | ICD-10-CM

## 2017-12-06 DIAGNOSIS — D72829 Elevated white blood cell count, unspecified: Secondary | ICD-10-CM

## 2017-12-06 LAB — COMPREHENSIVE METABOLIC PANEL
ALT: 37 IU/L (ref 0–44)
AST: 26 IU/L (ref 0–40)
Albumin/Globulin Ratio: 1.3 (ref 1.2–2.2)
Albumin: 4.7 g/dL (ref 3.6–4.8)
Alkaline Phosphatase: 75 IU/L (ref 39–117)
BUN/Creatinine Ratio: 24 (ref 10–24)
BUN: 22 mg/dL (ref 8–27)
Bilirubin Total: 0.3 mg/dL (ref 0.0–1.2)
CO2: 18 mmol/L — ABNORMAL LOW (ref 20–29)
Calcium: 10 mg/dL (ref 8.6–10.2)
Chloride: 99 mmol/L (ref 96–106)
Creatinine, Ser: 0.91 mg/dL (ref 0.76–1.27)
GFR calc Af Amer: 104 mL/min/{1.73_m2} (ref >59)
GFR calc non Af Amer: 90 mL/min/{1.73_m2} (ref >59)
Globulin, Total: 3.5 g/dL (ref 1.5–4.5)
Glucose: 103 mg/dL — ABNORMAL HIGH (ref 65–99)
Potassium: 4.6 mmol/L (ref 3.5–5.2)
Sodium: 139 mmol/L (ref 134–144)
Total Protein: 8.2 g/dL (ref 6.0–8.5)

## 2017-12-06 LAB — CBC WITH DIFFERENTIAL
Basophils Absolute: 0.1 10*3/uL (ref 0.0–0.2)
Basos: 1 %
EOS (ABSOLUTE): 0.2 10*3/uL (ref 0.0–0.4)
Eos: 1 %
Hematocrit: 49.2 % (ref 37.5–51.0)
Hemoglobin: 16.9 g/dL (ref 13.0–17.7)
Immature Grans (Abs): 0 10*3/uL (ref 0.0–0.1)
Immature Granulocytes: 0 %
Lymphocytes Absolute: 8.5 10*3/uL — ABNORMAL HIGH (ref 0.7–3.1)
Lymphs: 43 %
MCH: 31.2 pg (ref 26.6–33.0)
MCHC: 34.3 g/dL (ref 31.5–35.7)
MCV: 91 fL (ref 79–97)
Monocytes Absolute: 1 10*3/uL — ABNORMAL HIGH (ref 0.1–0.9)
Monocytes: 5 %
Neutrophils Absolute: 9.9 10*3/uL — ABNORMAL HIGH (ref 1.4–7.0)
Neutrophils: 50 %
RBC: 5.42 x10E6/uL (ref 4.14–5.80)
RDW: 13.2 % (ref 12.3–15.4)
WBC: 19.9 10*3/uL — ABNORMAL HIGH (ref 3.4–10.8)

## 2017-12-06 LAB — LIPID PANEL
Chol/HDL Ratio: 4.2 ratio (ref 0.0–5.0)
Cholesterol, Total: 214 mg/dL — ABNORMAL HIGH (ref 100–199)
HDL: 51 mg/dL (ref >39)
LDL Calculated: 134 mg/dL — ABNORMAL HIGH (ref 0–99)
Triglycerides: 143 mg/dL (ref 0–149)
VLDL Cholesterol Cal: 29 mg/dL (ref 5–40)

## 2017-12-06 LAB — HIV ANTIBODY (ROUTINE TESTING W REFLEX): HIV Screen 4th Generation wRfx: NONREACTIVE

## 2017-12-06 LAB — HEPATITIS C ANTIBODY: Hep C Virus Ab: 0.1 s/co ratio (ref 0.0–0.9)

## 2017-12-06 MED ORDER — ATORVASTATIN CALCIUM 80 MG PO TABS: 80.0000 mg | ORAL_TABLET | Freq: Every day | ORAL | 0 refills | Status: DC

## 2017-12-06 NOTE — Telephone Encounter (Signed)
-----   Message from Alan George, [place] sent at 12/06/2017 11:36 AM EDT ----- His metabolic panel is stable. His lipid profile shows LDL is not at goal for person with diabetes and I would like him to increase Lipitor to 80 mg daily. He can take two of his current tablets until finished and I will send in new script. His CBC shows an elevated white count. 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 I do not have recent comparison. I want him to repeat lab in one week, lab visit only come by on walk in basis. Does not need to be fasting.

## 2017-12-06 NOTE — Telephone Encounter (Signed)
Na. Mail box full 

## 2017-12-10 NOTE — Telephone Encounter (Signed)
Patient's wife advised as below. Patient's wife verbalizes understanding and is in agreement with treatment plan.   

## 2017-12-18 DIAGNOSIS — Z5181 Encounter for therapeutic drug level monitoring: Secondary | ICD-10-CM | POA: Diagnosis not present

## 2017-12-18 DIAGNOSIS — G8929 Other chronic pain: Secondary | ICD-10-CM | POA: Diagnosis not present

## 2017-12-18 DIAGNOSIS — Z79891 Long term (current) use of opiate analgesic: Secondary | ICD-10-CM | POA: Diagnosis not present

## 2017-12-18 DIAGNOSIS — M25561 Pain in right knee: Secondary | ICD-10-CM | POA: Diagnosis not present

## 2017-12-18 DIAGNOSIS — M170 Bilateral primary osteoarthritis of knee: Secondary | ICD-10-CM | POA: Diagnosis not present

## 2017-12-18 DIAGNOSIS — M25562 Pain in left knee: Secondary | ICD-10-CM | POA: Diagnosis not present

## 2017-12-18 DIAGNOSIS — M25552 Pain in left hip: Secondary | ICD-10-CM | POA: Diagnosis not present

## 2017-12-18 DIAGNOSIS — M5442 Lumbago with sciatica, left side: Secondary | ICD-10-CM | POA: Diagnosis not present

## 2017-12-19 ENCOUNTER — Other Ambulatory Visit: Payer: Self-pay | Admitting: Physician Assistant

## 2017-12-19 DIAGNOSIS — Z794 Long term (current) use of insulin: Principal | ICD-10-CM

## 2017-12-19 DIAGNOSIS — R809 Proteinuria, unspecified: Principal | ICD-10-CM

## 2017-12-19 DIAGNOSIS — E1129 Type 2 diabetes mellitus with other diabetic kidney complication: Secondary | ICD-10-CM

## 2017-12-19 MED ORDER — METFORMIN HCL 1000 MG PO TABS: 1000.0000 mg | ORAL_TABLET | Freq: Two times a day (BID) | ORAL | 0 refills | Status: DC

## 2017-12-19 NOTE — Telephone Encounter (Signed)
Pt needing refill on:  metFORMIN (GLUCOPHAGE) 1000 MG tablet  Please fill at: [id number] - [place], [place] - [address] AT [facility] [address] [phone] (Phone) [phone] (Fax)    Thanks, [facility]

## 2017-12-20 ENCOUNTER — Encounter: Payer: Self-pay | Admitting: Emergency Medicine

## 2017-12-20 DIAGNOSIS — F429 Obsessive-compulsive disorder, unspecified: Secondary | ICD-10-CM | POA: Insufficient documentation

## 2017-12-20 DIAGNOSIS — F329 Major depressive disorder, single episode, unspecified: Secondary | ICD-10-CM

## 2017-12-22 ENCOUNTER — Other Ambulatory Visit: Payer: Self-pay | Admitting: Physician Assistant

## 2017-12-22 DIAGNOSIS — K219 Gastro-esophageal reflux disease without esophagitis: Secondary | ICD-10-CM

## 2017-12-26 ENCOUNTER — Ambulatory Visit: Payer: Self-pay | Admitting: Psychiatry

## 2018-01-03 ENCOUNTER — Ambulatory Visit: Payer: Self-pay | Admitting: Psychiatry

## 2018-01-10 ENCOUNTER — Other Ambulatory Visit: Payer: Self-pay

## 2018-01-10 MED ORDER — MIRTAZAPINE 30 MG PO TABS: 30.0000 mg | ORAL_TABLET | Freq: Every day | ORAL | 0 refills | Status: DC

## 2018-01-15 ENCOUNTER — Ambulatory Visit: Payer: Self-pay | Admitting: Psychiatry

## 2018-02-23 ENCOUNTER — Other Ambulatory Visit: Payer: Self-pay | Admitting: Physician Assistant

## 2018-02-23 DIAGNOSIS — E785 Hyperlipidemia, unspecified: Secondary | ICD-10-CM

## 2018-02-25 ENCOUNTER — Ambulatory Visit: Payer: Medicare Other | Admitting: Psychiatry

## 2018-02-25 DIAGNOSIS — F429 Obsessive-compulsive disorder, unspecified: Secondary | ICD-10-CM | POA: Diagnosis not present

## 2018-02-25 DIAGNOSIS — F329 Major depressive disorder, single episode, unspecified: Secondary | ICD-10-CM

## 2018-02-25 MED ORDER — MIRTAZAPINE 30 MG PO TABS: 30.0000 mg | ORAL_TABLET | Freq: Every day | ORAL | 5 refills | Status: DC

## 2018-02-25 MED ORDER — SERTRALINE HCL 100 MG PO TABS: 200.0000 mg | ORAL_TABLET | Freq: Every day | ORAL | 5 refills | Status: DC

## 2018-02-25 NOTE — Progress Notes (Signed)
[facility] Check  Patient ID: Alan George.,  MRN: [id number]  PCP: Trey Sailors, PA-C  Date of Evaluation: 02/25/2018 Time spent:20 minutes  Chief Complaint:   HISTORY/CURRENT STATUS: HPI patient seen 05/ 19/19.  He was doing well.  Being treated for major depressive disorder, OCD, panic attacks.  At that time we discussed snap sleep apnea test but patient declined.  Individual Medical History/ Review of Systems: Changes? :No   Allergies: Dog epithelium; Dust mite extract; Pollen extract; Tree extract; Iodinated diagnostic agents; and Other  Current Medications:  Current Outpatient Medications:  .  mirtazapine (REMERON) 30 MG tablet, Take 1 tablet (30 mg total) by mouth at bedtime., Disp: 30 tablet, Rfl: 5 .  sertraline (ZOLOFT) 100 MG tablet, Take 2 tablets (200 mg total) by mouth daily., Disp: 60 tablet, Rfl: 5 .  albuterol (PROAIR HFA) 108 (90 Base) MCG/ACT inhaler, Inhale 2 puffs into the lungs every 4 (four) hours as needed for wheezing or shortness of breath., Disp: 1 Inhaler, Rfl: 5 .  atorvastatin (LIPITOR) 80 MG tablet, Take 1 tablet (80 mg total) by mouth daily., Disp: 90 tablet, Rfl: 0 .  cyclobenzaprine (FLEXERIL) 10 MG tablet, Take by mouth., Disp: , Rfl:  .  glipiZIDE (GLUCOTROL) 5 MG tablet, Take 1 tablet (5 mg total) by mouth 2 (two) times daily before a meal., Disp: 180 tablet, Rfl: 1 .  HYDROmorphone (DILAUDID) 4 MG tablet, take 0.5 tablet by mouth every 4 hours if needed, Disp: , Rfl: 0 .  Insulin Glargine (LANTUS SOLOSTAR) 100 UNIT/ML Solostar Pen, Inject 25 Units into the skin daily at 10 pm., Disp: 15 mL, Rfl: 11 .  losartan (COZAAR) 50 MG tablet, TAKE 1 TABLET(50 MG) BY MOUTH DAILY, Disp: 90 tablet, Rfl: 0 .  Melatonin 1 MG TABS, Take by mouth., Disp: , Rfl:  .  metFORMIN (GLUCOPHAGE) 1000 MG tablet, Take 1 tablet (1,000 mg total) by mouth 2 (two) times daily with a meal., Disp: 180 tablet, Rfl: 0 .  omeprazole (PRILOSEC) 20 MG capsule, TAKE 1  CAPSULE(20 MG) BY MOUTH DAILY, Disp: 90 capsule, Rfl: 1 .  Vitamin D, Ergocalciferol, (DRISDOL) 50000 UNITS CAPS capsule, Take by mouth., Disp: , Rfl:  Medication Side Effects: none  Family Medical/ Social History: Changes? no  MENTAL HEALTH EXAM:  There were no vitals taken for this visit.There is no height or weight on file to calculate BMI.  General Appearance: Casual  overweight  Eye Contact:  Good  Speech:  Clear and Coherent  Volume:  Normal  Mood:  Euthymic  Affect:  Appropriate  Thought Process:  Linear  Orientation:  Full (Time, Place, and Person)  Thought Content: Logical   Suicidal Thoughts:  No  Homicidal Thoughts:  No  Memory:  WNL  Judgement:  Good  Insight:  Good  Psychomotor Activity:  Normal  Concentration:  Concentration: Good  Recall:  Good  Fund of Knowledge: Good  Language: Good  Assets:  Desire for Improvement  ADL's:  Intact  Cognition: WNL  Prognosis:  Good    DIAGNOSES:    ICD-10-CM   1. Major depressive disorder with current active episode, unspecified depression episode severity, unspecified whether recurrent F32.9   2. Obsessive-compulsive disorder, unspecified type F42.9     Receiving Psychotherapy: No    RECOMMENDATIONS: Continue patient on his regular medicines.  Zoloft 200 mg a day, Remeron 30 mg at at bedtime. At this time he still declines a sleep study. Recheck 6 months.  Comer Locket, PA-C

## 2018-03-07 ENCOUNTER — Ambulatory Visit: Payer: Medicare Other | Admitting: Physician Assistant

## 2018-03-10 ENCOUNTER — Other Ambulatory Visit: Payer: Self-pay | Admitting: Physician Assistant

## 2018-03-10 DIAGNOSIS — Z794 Long term (current) use of insulin: Principal | ICD-10-CM

## 2018-03-10 DIAGNOSIS — E1129 Type 2 diabetes mellitus with other diabetic kidney complication: Secondary | ICD-10-CM

## 2018-03-10 DIAGNOSIS — R809 Proteinuria, unspecified: Principal | ICD-10-CM

## 2018-03-13 ENCOUNTER — Ambulatory Visit: Payer: Medicare Other | Admitting: Physician Assistant

## 2018-03-13 ENCOUNTER — Encounter: Payer: Self-pay | Admitting: Physician Assistant

## 2018-03-13 VITALS — BP 121/75 | HR 91 | Temp 98.5°F | Resp 16 | Wt 248.0 lb

## 2018-03-13 DIAGNOSIS — R809 Proteinuria, unspecified: Secondary | ICD-10-CM | POA: Diagnosis not present

## 2018-03-13 DIAGNOSIS — E1169 Type 2 diabetes mellitus with other specified complication: Secondary | ICD-10-CM

## 2018-03-13 DIAGNOSIS — I10 Essential (primary) hypertension: Secondary | ICD-10-CM

## 2018-03-13 DIAGNOSIS — D72829 Elevated white blood cell count, unspecified: Secondary | ICD-10-CM | POA: Diagnosis not present

## 2018-03-13 DIAGNOSIS — E785 Hyperlipidemia, unspecified: Secondary | ICD-10-CM

## 2018-03-13 DIAGNOSIS — Z794 Long term (current) use of insulin: Secondary | ICD-10-CM | POA: Diagnosis not present

## 2018-03-13 DIAGNOSIS — E1129 Type 2 diabetes mellitus with other diabetic kidney complication: Secondary | ICD-10-CM

## 2018-03-13 LAB — POCT GLYCOSYLATED HEMOGLOBIN (HGB A1C)
Est. average glucose Bld gHb Est-mCnc: 249
Hemoglobin A1C: 10.3 % — AB (ref 4.0–5.6)

## 2018-03-13 NOTE — Patient Instructions (Addendum)
Increase insulin to 28 units tonight. In 2-3 days increase insulin to 30 units at night. I will write you a new script.
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
## 2018-03-14 ENCOUNTER — Ambulatory Visit: Payer: Self-pay | Admitting: Pharmacist

## 2018-03-14 DIAGNOSIS — E1169 Type 2 diabetes mellitus with other specified complication: Secondary | ICD-10-CM

## 2018-03-14 DIAGNOSIS — I10 Essential (primary) hypertension: Secondary | ICD-10-CM

## 2018-03-14 DIAGNOSIS — Z794 Long term (current) use of insulin: Principal | ICD-10-CM

## 2018-03-14 DIAGNOSIS — E785 Hyperlipidemia, unspecified: Secondary | ICD-10-CM

## 2018-03-14 DIAGNOSIS — R809 Proteinuria, unspecified: Principal | ICD-10-CM

## 2018-03-14 DIAGNOSIS — E1129 Type 2 diabetes mellitus with other diabetic kidney complication: Secondary | ICD-10-CM

## 2018-03-14 NOTE — Chronic Care Management (AMB) (Signed)
  Chronic Care Management   Note  03/14/2018 Name: Alan George. MRN: [id number] DOB: 12-Jun-1955   63 y.o. year old male referred to Chronic Care Management by Osvaldo Angst, PA for disease state management related to diabetes and medication management. Chronic conditions include HTN, HLD, T2DM, reactive airway disease. Last office visit with Trey Sailors, PA-C was 03/13/18   Was unable to reach patient via telephone today and have left HIPAA compliant voicemail asking patient to return my call. (unsuccessful outreach #1)   Follow up plan: The CM team will reach out to the patient again over the next 7 days.   Karalee Height, PharmD Clinical Pharmacist [facility] [phone]

## 2018-03-17 MED ORDER — INSULIN GLARGINE 100 UNIT/ML SOLOSTAR PEN: 30.0000 [IU] | PEN_INJECTOR | Freq: Every day | SUBCUTANEOUS | 1 refills | Status: DC

## 2018-03-19 ENCOUNTER — Ambulatory Visit: Payer: Self-pay

## 2018-03-19 DIAGNOSIS — Z794 Long term (current) use of insulin: Principal | ICD-10-CM

## 2018-03-19 DIAGNOSIS — R809 Proteinuria, unspecified: Principal | ICD-10-CM

## 2018-03-19 DIAGNOSIS — E1129 Type 2 diabetes mellitus with other diabetic kidney complication: Secondary | ICD-10-CM

## 2018-03-19 DIAGNOSIS — I10 Essential (primary) hypertension: Secondary | ICD-10-CM

## 2018-03-19 NOTE — Chronic Care Management (AMB) (Signed)
  Chronic Care Management   Note  03/19/2018 Name: Alan George. MRN: [id number] DOB: [birth year]   63 y.o. year old male referred to Chronic Care Management by Osvaldo Angst, PA for disease state management related to diabetes and medication management. Chronic conditions include HTN, HLD, T2DM, reactive airway disease. Last office visit with Trey Sailors, PA-C was 03/13/18   Was unable to reach patient via telephone today and have left HIPAA compliant voicemail asking patient to return my call. (unsuccessful outreach #2)  The CM team will reach out to the patient again over the next 7 days.   Taeya Theall E. Suzie Portela, RN, BSN Nurse Care Coordinator [facility] Care Management [phone]

## 2018-03-20 DIAGNOSIS — M47818 Spondylosis without myelopathy or radiculopathy, sacral and sacrococcygeal region: Secondary | ICD-10-CM | POA: Diagnosis not present

## 2018-03-20 DIAGNOSIS — M7062 Trochanteric bursitis, left hip: Secondary | ICD-10-CM | POA: Diagnosis not present

## 2018-03-20 DIAGNOSIS — M170 Bilateral primary osteoarthritis of knee: Secondary | ICD-10-CM | POA: Diagnosis not present

## 2018-03-20 DIAGNOSIS — M47819 Spondylosis without myelopathy or radiculopathy, site unspecified: Secondary | ICD-10-CM | POA: Diagnosis not present

## 2018-03-20 DIAGNOSIS — F1190 Opioid use, unspecified, uncomplicated: Secondary | ICD-10-CM | POA: Diagnosis not present

## 2018-03-20 DIAGNOSIS — M1610 Unilateral primary osteoarthritis, unspecified hip: Secondary | ICD-10-CM | POA: Diagnosis not present

## 2018-03-24 ENCOUNTER — Ambulatory Visit: Payer: Self-pay | Admitting: Pharmacist

## 2018-03-24 DIAGNOSIS — E1169 Type 2 diabetes mellitus with other specified complication: Secondary | ICD-10-CM

## 2018-03-24 DIAGNOSIS — Z794 Long term (current) use of insulin: Principal | ICD-10-CM

## 2018-03-24 DIAGNOSIS — E1129 Type 2 diabetes mellitus with other diabetic kidney complication: Secondary | ICD-10-CM

## 2018-03-24 DIAGNOSIS — E785 Hyperlipidemia, unspecified: Secondary | ICD-10-CM

## 2018-03-24 DIAGNOSIS — I10 Essential (primary) hypertension: Secondary | ICD-10-CM

## 2018-03-24 DIAGNOSIS — R809 Proteinuria, unspecified: Principal | ICD-10-CM

## 2018-03-24 NOTE — Chronic Care Management (AMB) (Signed)
    Chronic Care Management   Note  03/24/2018 Name: Alan George. MRN: [id number] DOB: 1955/08/23  63 y.o. year old male referred to Chronic Care Management team for diabetes management, diet education, and medication assistance by Osvaldo Angst, PA-C.   CCM team services are being closed due to three unsuccessful outreach attempts.   Patient has been provided CCM contact information if he/she wishes to engage with care managers in the future.    Follow up plan: Next PCP appointment scheduled for: 06/19/18. CCM team is happy to meet with the patient during this time to introduce services.    Alan George, PharmD Clinical Pharmacist [facility] [phone]

## 2018-03-29 ENCOUNTER — Other Ambulatory Visit: Payer: Self-pay | Admitting: Physician Assistant

## 2018-03-29 DIAGNOSIS — R05 Cough: Secondary | ICD-10-CM

## 2018-03-29 DIAGNOSIS — R059 Cough, unspecified: Secondary | ICD-10-CM

## 2018-04-29 ENCOUNTER — Other Ambulatory Visit: Payer: Self-pay | Admitting: Physician Assistant

## 2018-04-29 DIAGNOSIS — I10 Essential (primary) hypertension: Secondary | ICD-10-CM

## 2018-04-29 DIAGNOSIS — R809 Proteinuria, unspecified: Secondary | ICD-10-CM

## 2018-04-29 DIAGNOSIS — E1129 Type 2 diabetes mellitus with other diabetic kidney complication: Secondary | ICD-10-CM

## 2018-04-29 DIAGNOSIS — Z794 Long term (current) use of insulin: Secondary | ICD-10-CM

## 2018-04-29 NOTE — Telephone Encounter (Signed)
Please advise 

## 2018-05-29 ENCOUNTER — Other Ambulatory Visit: Payer: Self-pay | Admitting: Physician Assistant

## 2018-05-29 DIAGNOSIS — E785 Hyperlipidemia, unspecified: Secondary | ICD-10-CM

## 2018-06-08 ENCOUNTER — Other Ambulatory Visit: Payer: Self-pay | Admitting: Physician Assistant

## 2018-06-08 DIAGNOSIS — R809 Proteinuria, unspecified: Principal | ICD-10-CM

## 2018-06-08 DIAGNOSIS — K219 Gastro-esophageal reflux disease without esophagitis: Secondary | ICD-10-CM

## 2018-06-08 DIAGNOSIS — E1129 Type 2 diabetes mellitus with other diabetic kidney complication: Secondary | ICD-10-CM

## 2018-06-08 DIAGNOSIS — Z794 Long term (current) use of insulin: Principal | ICD-10-CM

## 2018-06-17 DIAGNOSIS — M5442 Lumbago with sciatica, left side: Secondary | ICD-10-CM | POA: Diagnosis not present

## 2018-06-17 DIAGNOSIS — M25552 Pain in left hip: Secondary | ICD-10-CM | POA: Diagnosis not present

## 2018-06-17 DIAGNOSIS — M25561 Pain in right knee: Secondary | ICD-10-CM | POA: Diagnosis not present

## 2018-06-17 DIAGNOSIS — M1612 Unilateral primary osteoarthritis, left hip: Secondary | ICD-10-CM | POA: Diagnosis not present

## 2018-06-19 ENCOUNTER — Ambulatory Visit: Payer: Medicare Other | Admitting: Physician Assistant

## 2018-07-10 ENCOUNTER — Other Ambulatory Visit: Payer: Self-pay

## 2018-07-10 ENCOUNTER — Ambulatory Visit (INDEPENDENT_AMBULATORY_CARE_PROVIDER_SITE_OTHER): Payer: Medicare Other | Admitting: Physician Assistant

## 2018-07-10 ENCOUNTER — Encounter: Payer: Self-pay | Admitting: Physician Assistant

## 2018-07-10 VITALS — BP 142/81 | HR 107 | Temp 98.5°F | Resp 16 | Wt 243.6 lb

## 2018-07-10 DIAGNOSIS — Z794 Long term (current) use of insulin: Secondary | ICD-10-CM | POA: Diagnosis not present

## 2018-07-10 DIAGNOSIS — E1129 Type 2 diabetes mellitus with other diabetic kidney complication: Secondary | ICD-10-CM

## 2018-07-10 DIAGNOSIS — E1169 Type 2 diabetes mellitus with other specified complication: Secondary | ICD-10-CM

## 2018-07-10 DIAGNOSIS — R809 Proteinuria, unspecified: Secondary | ICD-10-CM

## 2018-07-10 DIAGNOSIS — I10 Essential (primary) hypertension: Secondary | ICD-10-CM | POA: Diagnosis not present

## 2018-07-10 DIAGNOSIS — J45909 Unspecified asthma, uncomplicated: Secondary | ICD-10-CM | POA: Diagnosis not present

## 2018-07-10 DIAGNOSIS — E785 Hyperlipidemia, unspecified: Secondary | ICD-10-CM

## 2018-07-10 LAB — POCT GLYCOSYLATED HEMOGLOBIN (HGB A1C): Hemoglobin A1C: 9.2 % — AB (ref 4.0–5.6)

## 2018-07-10 MED ORDER — DULAGLUTIDE 0.75 MG/0.5ML ~~LOC~~ SOAJ: 0.7500 mg | SUBCUTANEOUS | 0 refills | Status: DC

## 2018-07-10 NOTE — Progress Notes (Signed)
Patient: Alan George. Male    DOB: [birth year]   63 y.o.   MRN: [id number] Visit Date: 07/10/2018  Today's Provider: Trey Sailors, PA-C   Chief Complaint  Patient presents with  . Diabetes  . Hypertension  . Hyperlipidemia   Subjective:     HPI   Diabetes Mellitus Type II, Follow-up:   Lab Results  Component Value Date   HGBA1C 9.2 (A) 07/10/2018   HGBA1C 10.3 (A) 03/13/2018   HGBA1C 7.8 (A) 12/05/2017   Last seen for diabetes 4 months ago.  Management since then includes Resuming insulin at prescribed dose of 30 units daily. He was previously rationing this due to cost. He is currently also taking metformin 1000 mg BID. He was previously on Bydureon once weekly but stopped due to cost.  He reports excellent compliance with treatment. He is not having side effects.  Current symptoms include none and have been unchanged. Home blood sugar records: 115-296  Referral was placed last visit to CCM but patient was unable to be contacted and referral was closed after three failed attempts.    Episodes of hypoglycemia? no   Current Insulin Regimen: n/a Most Recent Eye Exam: Not UTD. Reports he had visit scheduled in February which was pushed out due to COVID and he is waiting to be scheduled.  Weight trend: stable Prior visit with dietician: no Current diet: in general, a "healthy" diet   Current exercise: none  ------------------------------------------------------------------------   Hypertension, follow-up:  BP Readings from Last 3 Encounters:  07/10/18 (!) 142/81  03/13/18 121/75  12/05/17 130/90    He was last seen for hypertension 4 months ago.  BP at that visit was 121/75 . Management since that visit includes none.He reports excellent compliance with treatment. He is not having side effects.  He is not exercising. He is adherent to low salt diet.   Outside blood pressures are systolic ranging in the 130s and diastolic ranging from 70-90s. He  is experiencing none.  Patient denies chest pain, chest pressure/discomfort, claudication, dyspnea, exertional chest pressure/discomfort, fatigue, irregular heart beat, lower extremity edema, near-syncope, orthopnea, palpitations, paroxysmal nocturnal dyspnea, syncope and tachypnea.   Cardiovascular risk factors include advanced age (older than 69 for men, 66 for women), diabetes mellitus, hypertension, male gender and obesity (BMI >= 30 kg/m2).  Use of agents associated with hypertension: none.   ------------------------------------------------------------------------    Lipid/Cholesterol, Follow-up:   Last seen for this 4 months ago.  Management since that visit includes increasing Lipitor to 80mg .  Last Lipid Panel:    Component Value Date/Time   CHOL 214 (H) 12/05/2017 0939   TRIG 143 12/05/2017 0939   HDL 51 12/05/2017 0939   CHOLHDL 4.2 12/05/2017 0939   CHOLHDL 2.9 12/19/2016 0950   VLDL 25 07/03/2016 0938   LDLCALC 134 (H) 12/05/2017 0939   LDLCALC 68 12/19/2016 0950    He reports excellent compliance with treatment. He is not having side effects.   Wt Readings from Last 3 Encounters:  07/10/18 243 lb 9.6 oz (110.5 kg)  03/13/18 248 lb (112.5 kg)  12/05/17 253 lb (114.8 kg)    ------------------------------------------------------------------------    Allergies  Allergen Reactions  . Dog Epithelium   . Dust Mite Extract   . Pollen Extract   . Tree Extract   . Iodinated Diagnostic Agents Hives    ?gadolinium-thinks it was after MRI  . Other Rash     Current Outpatient Medications:  .  albuterol (PROAIR HFA) 108 (90 Base) MCG/ACT inhaler, Inhale 2 puffs into the lungs every 4 (four) hours as needed for wheezing or shortness of breath., Disp: 1 Inhaler, Rfl: 5 .  atorvastatin (LIPITOR) 80 MG tablet, TAKE 1 TABLET(80 MG) BY MOUTH DAILY, Disp: 90 tablet, Rfl: 0 .  cyclobenzaprine (FLEXERIL) 10 MG tablet, Take by mouth., Disp: , Rfl:  .  HYDROmorphone  (DILAUDID) 4 MG tablet, take 0.5 tablet by mouth every 4 hours if needed, Disp: , Rfl: 0 .  Insulin Glargine (LANTUS SOLOSTAR) 100 UNIT/ML Solostar Pen, Inject 30 Units into the skin daily., Disp: 30 mL, Rfl: 1 .  losartan (COZAAR) 50 MG tablet, TAKE 1 TABLET(50 MG) BY MOUTH DAILY, Disp: 90 tablet, Rfl: 1 .  Melatonin 1 MG TABS, Take by mouth., Disp: , Rfl:  .  metFORMIN (GLUCOPHAGE) 1000 MG tablet, TAKE 1 TABLET(1000 MG) BY MOUTH TWICE DAILY WITH A MEAL, Disp: 180 tablet, Rfl: 0 .  mirtazapine (REMERON) 30 MG tablet, Take 1 tablet (30 mg total) by mouth at bedtime., Disp: 30 tablet, Rfl: 5 .  omeprazole (PRILOSEC) 20 MG capsule, TAKE 1 CAPSULE(20 MG) BY MOUTH DAILY, Disp: 90 capsule, Rfl: 1 .  sertraline (ZOLOFT) 100 MG tablet, Take 2 tablets (200 mg total) by mouth daily., Disp: 60 tablet, Rfl: 5 .  Dulaglutide (TRULICITY) 0.75 MG/0.5ML SOPN, Inject 0.75 mg into the skin once a week., Disp: 4 pen, Rfl: 0  Review of Systems  Constitutional: Negative.   HENT: Negative.   Respiratory: Negative.   Gastrointestinal: Negative.   Endocrine: Negative.   Genitourinary: Negative.   Musculoskeletal: Negative.     Social History   Tobacco Use  . Smoking status: Former Games developer  . Smokeless tobacco: Never Used  Substance Use Topics  . Alcohol use: No    Alcohol/week: 0.0 standard drinks      Objective:   BP (!) 142/81   Pulse (!) 107   Temp 98.5 F (36.9 C) (Oral)   Resp 16   Wt 243 lb 9.6 oz (110.5 kg)   BMI 33.04 kg/m  Vitals:   07/10/18 0811  BP: (!) 142/81  Pulse: (!) 107  Resp: 16  Temp: 98.5 F (36.9 C)  TempSrc: Oral  Weight: 243 lb 9.6 oz (110.5 kg)     Physical Exam Constitutional:      Appearance: Normal appearance.  Cardiovascular:     Rate and Rhythm: Normal rate and regular rhythm.     Heart sounds: Normal heart sounds.  Pulmonary:     Effort: Pulmonary effort is normal.     Breath sounds: Normal breath sounds.  Skin:    General: Skin is warm and dry.    Neurological:     Mental Status: He is alert and oriented to person, place, and time. Mental status is at baseline.  Psychiatric:        Mood and Affect: Mood normal.         Assessment & Plan    1. Type 2 diabetes mellitus with microalbuminuria, with long-term current use of insulin (HCC)  A1c is down from 10.3% to 9.2% today. Patient reports cutting out carbs and sweetened foods and resuming insulin as prescribed. Remains uncontrolled and will start Trulicity as below at 0.75 mg once weekly with plan to increase to 1.5 mg weekly after one month. He previously tolerated Bydureon without issues. I have placed another referral to CCM for medication assistance as he has difficulty affording medications. The # ending in [phone]  should be contacted, this is his wife.   - POCT glycosylated hemoglobin (Hb A1C) - Dulaglutide (TRULICITY) 0.75 MG/0.5ML SOPN; Inject 0.75 mg into the skin once a week.  Dispense: 4 pen; Refill: 0 - Referral to Chronic Care Management Services  2. Essential hypertension  Controlled, continue current medication.  3. Hyperlipidemia associated with type 2 diabetes mellitus (HCC)  Continue Lipitor 80 mg daily, labs at next visit.   4. Asthma, unspecified asthma severity, unspecified whether complicated, unspecified whether persistent  He is still using Breo though not everyday.   The entirety of the information documented in the History of Present Illness, Review of Systems and Physical Exam were personally obtained by me. Portions of this information were initially documented by Sheliah HatchKathleen Wolford, CMA and reviewed by me for thoroughness and accuracy.   F/u 3 months.     Trey SailorsAdriana M Mikaia Janvier, PA-C  [facility] Patient seen and examined by Kerry DoryAdriana Evagelia Knack P.A-C, note scribed by Sheliah HatchKathleen Wolford, NCMA

## 2018-07-10 NOTE — Patient Instructions (Signed)
Diabetes Mellitus and Exercise Exercising regularly is important for your overall health, especially when you have diabetes (diabetes mellitus). Exercising is not only about losing weight. It has many other health benefits, such as increasing muscle strength and bone density and reducing body fat and stress. This leads to improved fitness, flexibility, and endurance, all of which result in better overall health. Exercise has additional benefits for people with diabetes, including:  Reducing appetite.  Helping to lower and control blood glucose.  Lowering blood pressure.  Helping to control amounts of fatty substances (lipids) in the blood, such as cholesterol and triglycerides.  Helping the body to respond better to insulin (improving insulin sensitivity).  Reducing how much insulin the body needs.  Decreasing the risk for heart disease by: ? Lowering cholesterol and triglyceride levels. ? Increasing the levels of good cholesterol. ? Lowering blood glucose levels. What is my activity plan? Your health care provider or certified diabetes educator can help you make a plan for the type and frequency of exercise (activity plan) that works for you. Make sure that you:  Do at least 150 minutes of moderate-intensity or vigorous-intensity exercise each week. This could be brisk walking, biking, or water aerobics. ? Do stretching and strength exercises, such as yoga or weightlifting, at least 2 times a week. ? Spread out your activity over at least 3 days of the week.  Get some form of physical activity every day. ? Do not go more than 2 days in a row without some kind of physical activity. ? Avoid being inactive for more than 30 minutes at a time. Take frequent breaks to walk or stretch.  Choose a type of exercise or activity that you enjoy, and set realistic goals.  Start slowly, and gradually increase the intensity of your exercise over time. What do I need to know about managing my  diabetes?   Check your blood glucose before and after exercising. ? If your blood glucose is 240 mg/dL (13.3 mmol/L) or higher before you exercise, check your urine for ketones. If you have ketones in your urine, do not exercise until your blood glucose returns to normal. ? If your blood glucose is 100 mg/dL (5.6 mmol/L) or lower, eat a snack containing 15-20 grams of carbohydrate. Check your blood glucose 15 minutes after the snack to make sure that your level is above 100 mg/dL (5.6 mmol/L) before you start your exercise.  Know the symptoms of low blood glucose (hypoglycemia) and how to treat it. Your risk for hypoglycemia increases during and after exercise. Common symptoms of hypoglycemia can include: ? Hunger. ? Anxiety. ? Sweating and feeling clammy. ? Confusion. ? Dizziness or feeling light-headed. ? Increased heart rate or palpitations. ? Blurry vision. ? Tingling or numbness around the mouth, lips, or tongue. ? Tremors or shakes. ? Irritability.  Keep a rapid-acting carbohydrate snack available before, during, and after exercise to help prevent or treat hypoglycemia.  Avoid injecting insulin into areas of the body that are going to be exercised. For example, avoid injecting insulin into: ? The arms, when playing tennis. ? The legs, when jogging.  Keep records of your exercise habits. Doing this can help you and your health care provider adjust your diabetes management plan as needed. Write down: ? Food that you eat before and after you exercise. ? Blood glucose levels before and after you exercise. ? The type and amount of exercise you have done. ? When your insulin is expected to peak, if you use   insulin. Avoid exercising at times when your insulin is peaking.  When you start a new exercise or activity, work with your health care provider to make sure the activity is safe for you, and to adjust your insulin, medicines, or food intake as needed.  Drink plenty of water while  you exercise to prevent dehydration or heat stroke. Drink enough fluid to keep your urine clear or pale yellow. Summary  Exercising regularly is important for your overall health, especially when you have diabetes (diabetes mellitus).  Exercising has many health benefits, such as increasing muscle strength and bone density and reducing body fat and stress.  Your health care provider or certified diabetes educator can help you make a plan for the type and frequency of exercise (activity plan) that works for you.  When you start a new exercise or activity, work with your health care provider to make sure the activity is safe for you, and to adjust your insulin, medicines, or food intake as needed. This information is not intended to replace advice given to you by your health care provider. Make sure you discuss any questions you have with your health care provider. Document Released: 04/28/2003 Document Revised: 08/16/2016 Document Reviewed: 07/18/2015 Elsevier Interactive Patient Education  2019 [employer]vier Inc[employer]  

## 2018-07-21 ENCOUNTER — Ambulatory Visit: Payer: Self-pay

## 2018-07-21 NOTE — Chronic Care Management (AMB) (Signed)
    Care Management   Unsuccessful Call Note 07/21/2018 Name: Alan George. MRN: [id number] DOB: Aug 15, 1955  Alan George. is a 63 year old male who sees Osvaldo Angst, [place] for primary care. Ms. Jodi Marble asked the CCM team to consult the patient for care management secondary to medication affordability. CCM Team attempted to engage patient 03/2018 however patient would not return calls. Referral was placed 07/10/2018 during last office visit.    Was unable to reach patient via telephone today for introduction to CCM services. I have left HIPAA compliant voicemail asking patient to return my call. (unsuccessful outreach #1).   Plan: Will follow-up within 7 business days via telephone.    Rita Vialpando E. Suzie Portela, RN, BSN Nurse Care Coordinator [facility] [phone]

## 2018-07-23 ENCOUNTER — Ambulatory Visit: Payer: Self-pay

## 2018-07-23 DIAGNOSIS — E1129 Type 2 diabetes mellitus with other diabetic kidney complication: Secondary | ICD-10-CM

## 2018-07-23 DIAGNOSIS — I10 Essential (primary) hypertension: Secondary | ICD-10-CM

## 2018-07-23 NOTE — Chronic Care Management (AMB) (Signed)
  Chronic Care Management   Note  07/23/2018 Name: Alan George. MRN: [id number] DOB: 04-18-1955  Care Coordination: Incoming call at 0928 from patient wife, [place] Scaff. Ms. Kwiatkowski states she is returning RN CM call. RN CM introduced wife to CCM services and reason for referral (wife on [place]). Wife feels patient is in need of CCM services and request RN CM call back tomorrow (Thursday) to speak with patient.  Plan: Will follow up with patient tomorrow after 12:00 as wife has requested.  Senetra Dillin E. Suzie Portela, RN, BSN Nurse Care Coordinator [facility] Practice/[facility] Care Management [phone]

## 2018-07-24 ENCOUNTER — Other Ambulatory Visit: Payer: Self-pay | Admitting: Physician Assistant

## 2018-07-24 DIAGNOSIS — M47819 Spondylosis without myelopathy or radiculopathy, site unspecified: Secondary | ICD-10-CM | POA: Diagnosis not present

## 2018-07-24 DIAGNOSIS — M5442 Lumbago with sciatica, left side: Secondary | ICD-10-CM | POA: Diagnosis not present

## 2018-07-24 DIAGNOSIS — M25562 Pain in left knee: Secondary | ICD-10-CM | POA: Diagnosis not present

## 2018-07-24 DIAGNOSIS — M1610 Unilateral primary osteoarthritis, unspecified hip: Secondary | ICD-10-CM | POA: Diagnosis not present

## 2018-07-24 DIAGNOSIS — R059 Cough, unspecified: Secondary | ICD-10-CM

## 2018-07-24 DIAGNOSIS — G8929 Other chronic pain: Secondary | ICD-10-CM | POA: Diagnosis not present

## 2018-07-24 DIAGNOSIS — M25561 Pain in right knee: Secondary | ICD-10-CM | POA: Diagnosis not present

## 2018-07-24 DIAGNOSIS — R05 Cough: Secondary | ICD-10-CM

## 2018-07-24 DIAGNOSIS — Z5181 Encounter for therapeutic drug level monitoring: Secondary | ICD-10-CM | POA: Diagnosis not present

## 2018-07-24 DIAGNOSIS — M47818 Spondylosis without myelopathy or radiculopathy, sacral and sacrococcygeal region: Secondary | ICD-10-CM | POA: Diagnosis not present

## 2018-07-24 DIAGNOSIS — Z79891 Long term (current) use of opiate analgesic: Secondary | ICD-10-CM | POA: Diagnosis not present

## 2018-07-24 NOTE — Telephone Encounter (Signed)
Please review

## 2018-07-28 ENCOUNTER — Ambulatory Visit: Payer: Self-pay

## 2018-07-28 DIAGNOSIS — E1129 Type 2 diabetes mellitus with other diabetic kidney complication: Secondary | ICD-10-CM

## 2018-07-28 DIAGNOSIS — I10 Essential (primary) hypertension: Secondary | ICD-10-CM

## 2018-07-28 NOTE — Chronic Care Management (AMB) (Signed)
    Care Management   Unsuccessful Call Note 07/21/2018 Name: Britney Captain.       MRN: [id number]       DOB: 09-07-1955  Eulis Foster. is a 63 year old malewho sees Carles Collet, [place] for primary care. Ms. [name] the CCM team to consult the patient for care management secondary to medication affordability. CCM Team attempted to engage patient 03/2018 however patient would not return calls. Referral was placed5/21/2020 during last office visit.   Was unable to reach patient/patient wife via telephone today forintroduction to CCM services. Ihave left HIPAA compliant voicemail asking patient to return my call. (unsuccessful outreach #2).  Plan: Will follow-up within 7business days via telephone.   Quenten Nawaz E. Rollene Rotunda, RN, BSN Nurse Care Coordinator [facility] Practice/[facility] Care Management [phone]

## 2018-07-29 ENCOUNTER — Ambulatory Visit: Payer: Self-pay

## 2018-07-29 NOTE — Chronic Care Management (AMB) (Signed)
  Chronic Care Management   Note  07/29/2018 Name: Alan George. MRN: [id number] DOB: May 08, 1955  Care Coordination: Incoming VM message from patient wife. She works third shift and will not be available for call back as she is going to bed. States she is off on Friday to take joint call with husband to discuss CCM Services. RN CM returned call and left voicemail confirming that patient would be placed on CM schedule for Friday at 2:00.  Plan: Will follow up with patient and wife as scheduled  Maralee Higuchi E. Rollene Rotunda, RN, BSN Nurse Care Coordinator [facility] Practice/[facility] Care Management [phone]

## 2018-07-31 ENCOUNTER — Other Ambulatory Visit: Payer: Self-pay

## 2018-07-31 MED ORDER — SERTRALINE HCL 100 MG PO TABS: 200.0000 mg | ORAL_TABLET | Freq: Every day | ORAL | 1 refills | Status: DC

## 2018-07-31 MED ORDER — MIRTAZAPINE 30 MG PO TABS: 30.0000 mg | ORAL_TABLET | Freq: Every day | ORAL | 1 refills | Status: DC

## 2018-08-01 ENCOUNTER — Ambulatory Visit: Payer: Self-pay

## 2018-08-01 ENCOUNTER — Other Ambulatory Visit: Payer: Self-pay

## 2018-08-01 DIAGNOSIS — I10 Essential (primary) hypertension: Secondary | ICD-10-CM

## 2018-08-01 DIAGNOSIS — Z794 Long term (current) use of insulin: Secondary | ICD-10-CM

## 2018-08-01 DIAGNOSIS — E1129 Type 2 diabetes mellitus with other diabetic kidney complication: Secondary | ICD-10-CM

## 2018-08-01 NOTE — Chronic Care Management (AMB) (Signed)
  Care Management   Note  08/01/2018 Name: Kaidan Harpster. MRN: [id number] DOB: [birth year]  Eulis Foster. is a 63 year old malewho sees Carles Collet, [place] for primary care. Ms. [name] the CCM team to consult the patient for care management secondary to medication affordability and DM management. Telephone outreach today to patient/patient wife to introduce ccm services.  SDOH (Social Determinants of Health) screening performed today. See Care Plan Entry related to challenges with: Social Connections Physical Activity  Mr. Billman was given information about Care Management services today including:  1. Case Management services include personalized support from designated clinical staff supervised by a physician, including individualized plan of care and coordination with other care providers 2. 24/7 contact phone numbers for assistance for urgent and routine care needs. 3. The patient may stop CCM services at any time (effective at the end of the month) by phone call to the office staff.   Patient agreed to services and verbal consent obtained.     Plan: Initial telephone assessment scheduled for Wednesday 08/06/2018 at 8:30  Torianne Laflam E. Rollene Rotunda, RN, BSN Nurse Care Coordinator [facility] Practice/[facility] Care Management [phone]

## 2018-08-06 ENCOUNTER — Other Ambulatory Visit: Payer: Self-pay

## 2018-08-06 ENCOUNTER — Other Ambulatory Visit: Payer: Self-pay | Admitting: Physician Assistant

## 2018-08-06 ENCOUNTER — Ambulatory Visit (INDEPENDENT_AMBULATORY_CARE_PROVIDER_SITE_OTHER): Payer: Medicare Other

## 2018-08-06 DIAGNOSIS — E1129 Type 2 diabetes mellitus with other diabetic kidney complication: Secondary | ICD-10-CM

## 2018-08-06 DIAGNOSIS — R809 Proteinuria, unspecified: Secondary | ICD-10-CM | POA: Diagnosis not present

## 2018-08-06 DIAGNOSIS — E785 Hyperlipidemia, unspecified: Secondary | ICD-10-CM

## 2018-08-06 DIAGNOSIS — E1169 Type 2 diabetes mellitus with other specified complication: Secondary | ICD-10-CM

## 2018-08-06 DIAGNOSIS — Z794 Long term (current) use of insulin: Secondary | ICD-10-CM
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
## 2018-08-06 NOTE — Patient Instructions (Addendum)
Thank you allowing the Chronic Care Management Team to be a part of your care! It was a pleasure speaking with you today!  1. Please continue to take all medications as prescribed 2. Read all the information below carefully. I have tried to highlight the important things in red. 3. Please also check your email for educational materials from [facility]. 4. Please begin to read food lables for portion size and carbohydrate count. Remember, per the ADA (American Diabetes Association) website, you can have 45-60 grams of carbs at each meal (3 meals a day) and a 1 time snack of 15-30 grams of carbs before bedtime (evening snack). 5. I have text your wife pictures of our "Planning Healthy Meals" educational fold. You need to eat more balanced meals including protein at each meal. 6. Cardio exercise is your friend. Not only will it reduce your blood sugars, it will reduce your risk of stroke and heart attack. 7. I have enclosed a copy of Advanced Directives (coming in the mail). You can make copies of this document for your wife if you would like. If you choose to fill it out, please provide a copy to Fabio Bering so it can be placed in your medial records.   CCM (Chronic Care Management) Team   Trish Fountain RN, BSN Nurse Care Coordinator  [phone]  [name] PharmD  Clinical Pharmacist  [phone]   Elliot Gurney, LCSW Clinical Social Worker [phone]  Goals Addressed            This Visit's Progress   . per wife "We really need to get his sugars under control because they are in the 200s" (pt-stated)       Current Barriers:  [name] Knowledge Deficits related to basic Diabetes pathophysiology and self care/management . Financial strain (medication cost once in doughnut hole) . Sleep schedule  Nurse Case Manager Clinical Goal(s):  [name] Over the next 14 days, patient will demonstrate improved adherence to prescribed treatment plan for diabetes self care/management as evidenced by  checking blood glucose levels as prescribed, adhering to ADA/low carb diet, daily exercise, and medication adherence  Interventions:  . Provided education to patient re: diabetes . Reviewed medications with patient and discussed importance of medication adherence . Discussed plans with patient for ongoing care management follow up and provided patient with direct contact information for care management team . Provided patient with written educational materials related to hypo and hyperglycemia and importance of correct treatment . Reviewed scheduled/upcoming provider appointments including:  . Advised patient, providing education and rationale, to check cbg before breakfast and 2 hours after dinner and record  . Refer to [facility] Pharmacist for inhaler and trulicity cost once in doughnut hole  Patient Self Care Activities:  . Self administers medications as prescribed . Attends all scheduled provider appointments . Checks blood sugars as prescribed and utilize hyper and hypoglycemia protocol as needed . Adhere to ADA diet as discussed  Plan:  . RNCM will provide educational materials including Advanced Directives document via postal mail and email   Initial goal documentation        Print copy of patient instructions provided.   Telephone follow up appointment with care management team member scheduled for: 08/28/2018 at [place] have any symptoms of stroke. "BE FAST" is an easy way to remember the main warning signs: ? B - Balance. Signs are dizziness, sudden trouble walking, or loss of balance. ? E - Eyes.  Signs are trouble seeing or a sudden change in how you see. ? F - Face. Signs are sudden weakness or loss of feeling of the face, or the face or eyelid drooping on one side. ? A - Arms. Signs are weakness or loss of feeling in an arm. This happens suddenly and usually on one side of the body. ? S - Speech. Signs are sudden trouble speaking, slurred  speech, or trouble understanding what people say. ? T - Time. Time to call emergency services. Write down what time symptoms started.  You have other signs of stroke, such as: ? A sudden, very bad headache with no known cause. ? Feeling sick to your stomach (nausea). ? Throwing up (vomiting). ? Jerky movements you cannot control (seizure).  SYMPTOMS OF A HEART ATTACK  What are the signs or symptoms? Symptoms of this condition include:  Chest pain. It may feel like: ? Crushing or squeezing. ? Tightness, pressure, fullness, or heaviness.  Pain in the arm, neck, jaw, back, or upper body.  Shortness of breath.  Heartburn.  Indigestion.  Nausea.  Cold sweats.  Feeling tired.  Sudden lightheadedness.  Type 2 Diabetes Mellitus, Self Care, Adult When you have type 2 diabetes (type 2 diabetes mellitus), you must make sure your blood sugar (glucose) stays in a healthy range. You can do this with:  Nutrition.  Exercise.  Lifestyle changes.  Medicines or insulin, if needed.  Support from your doctors and others. How to stay aware of blood sugar   Check your blood sugar level every day, as often as told.  Have your A1c (hemoglobin A1c) level checked two or more times a year. Have it checked more often if your doctor tells you to. Your doctor will set personal treatment goals for you. Generally, you should have these blood sugar levels:  Before meals (preprandial): 80-130 mg/dL (4.4-7.2 mmol/L).  After meals (postprandial): below 180 mg/dL (10 mmol/L). THIS IS 2 HOURS AFTER A MEAL A1c level: less than 7% YOUR LAST A1C WAS 9.2 WHICH PUTS YOU AT A SIGNIFICANT RISK FOR STROKE, HEART ATTACK, AND KIDNEY DISEASE  How to manage high and low blood sugar Signs of high blood sugar High blood sugar is called hyperglycemia. Know the signs of high blood sugar. Signs may include:  Feeling: ? Thirsty. ? Hungry. ? Very tired.  Needing to pee (urinate) more than usual.  Blurry  vision. Signs of low blood sugar  Low blood sugar is called hypoglycemia. This is when blood sugar is at or below 70 mg/dL (3.9 mmol/L). Signs may include:  Feeling: ? Hungry. ? Worried or nervous (anxious). ? Sweaty and clammy. ? Confused. ? Dizzy. ? Sleepy. ? Sick to your stomach (nauseous).  Having: ? A fast heartbeat. ? A headache. ? A change in your vision. ? Jerky movements that you cannot control (seizure). ? Tingling or no feeling (numbness) around your mouth, lips, or tongue.  Having trouble with: ? Moving (coordination). ? Sleeping. ? Passing out (fainting). ? Getting upset easily (irritability). Treating low blood sugar To treat low blood sugar, eat or drink something sugary right away. If you can think clearly and swallow safely, follow the 15:15 rule:  Take 15 grams of a fast-acting carb (carbohydrate). Talk with your doctor about how much you should take.  Some fast-acting carbs are: ? Sugar tablets (glucose pills). Take 3-4 pills. ? 6-8 pieces of hard candy. ? 4-6 oz (120-150 mL) of fruit juice. ? 4-6 oz (120-150 mL) of regular (  not diet) soda. ? 1 Tbsp (15 mL) honey or sugar.  Check your blood sugar 15 minutes after you take the carb.  If your blood sugar is still at or below 70 mg/dL (3.9 mmol/L), take 15 grams of a carb again.  If your blood sugar does not go above 70 mg/dL (3.9 mmol/L) after 3 tries, get help right away.  After your blood sugar goes back to normal, eat a meal or a snack within 1 hour. Treating very low blood sugar If your blood sugar is at or below 54 mg/dL (3 mmol/L), you have very low blood sugar (severe hypoglycemia). This is an emergency. Do not wait to see if the symptoms will go away. Get medical help right away. Call your local emergency services (911 in the [place] If you have very low blood sugar and you cannot eat or drink, you may need a glucagon shot (injection). A family member or friend should learn how to check your blood  sugar and how to give you a glucagon shot. Ask your doctor if you need to have a glucagon shot kit at home. Follow these instructions at home: Medicine  Take insulin and diabetes medicines as told.  If your doctor says you should take more or less insulin and medicines, do this exactly as told.  Do not run out of insulin or medicines. Having diabetes can raise your risk for other long-term conditions. These include heart disease and kidney disease. Your doctor may prescribe medicines to help you not have these problems. Food   Make healthy food choices. These include: USE THE PICTURES OF THE FOOD GUIDE I TEXT YOU ? Chicken, fish, egg whites, and beans. ? Oats, whole wheat, bulgur, brown rice, quinoa, and millet. ? Fresh fruits and vegetables. ? Low-fat dairy products. ? Nuts, avocado, olive oil, and canola oil.  Meet with a food specialist (dietitian). He or she can help you make an eating plan that is right for you.  Follow instructions from your doctor about what you cannot eat or drink.  Drink enough fluid to keep your pee (urine) pale yellow. PREFERABLY WATER  Keep track of carbs that you eat. Do this by reading food labels and learning food serving sizes.  Follow your sick day plan when you cannot eat or drink normally. Make this plan with your doctor so it is ready to use. Activity  Exercise 3 or more times a week.  Do not go more than 2 days without exercising.  Talk with your doctor before you start a new exercise. Your doctor may need to tell you to change: ? How much insulin or medicines you take. ? How much food you eat. Lifestyle  Do not use any tobacco products. These include cigarettes, chewing tobacco, and e-cigarettes. If you need help quitting, ask your doctor.  Ask your doctor how much alcohol is safe for you.  Learn to deal with stress. If you need help with this, ask your doctor. Body care   Stay up to date with your shots (immunizations).  Have  your eyes and feet checked by a doctor as often as told.  Check your skin and feet every day. Check for cuts, bruises, redness, blisters, or sores.  Brush your teeth and gums two times a day. Floss one or more times a day.  Go to the dentist one or more times every 6 months.  Stay at a healthy weight. General instructions  Take over-the-counter and prescription medicines only as told by  your doctor.  Share your diabetes care plan with: ? Your work or school. ? People you live with.  Carry a card or wear jewelry that says you have diabetes.  Keep all follow-up visits as told by your doctor. This is important. Questions to ask your doctor  Do I need to meet with a diabetes educator?  Where can I find a support group for people with diabetes? Where to find more information To learn more about diabetes, visit:  American Diabetes Association: www.diabetes.org  American Association of Diabetes Educators: www.diabeteseducator.org Summary  When you have type 2 diabetes, you must make sure your blood sugar (glucose) stays in a healthy range.  Check your blood sugar every day, as often as told.  Having diabetes can raise your risk for other conditions. Your doctor may prescribe medicines to help you not have these problems.  Keep all follow-up visits as told by your doctor. This is important. This information is not intended to replace advice given to you by your health care provider. Make sure you discuss any questions you have with your health care provider. Document Released: 05/30/2015 Document Revised: 07/29/2017 Document Reviewed: 03/11/2015 Elsevier Interactive Patient Education  2019 [employer].   Carbohydrate Counting for Diabetes Mellitus, Adult  Carbohydrate counting is a method of keeping track of how many carbohydrates you eat. Eating carbohydrates naturally increases the amount of sugar (glucose) in the blood. Counting how many carbohydrates you eat helps keep  your blood glucose within normal limits, which helps you manage your diabetes (diabetes mellitus). It is important to know how many carbohydrates you can safely have in each meal. This is different for every person. A diet and nutrition specialist (registered dietitian) can help you make a meal plan and calculate how many carbohydrates you should have at each meal and snack. Carbohydrates are found in the following foods:  Grains, such as breads and cereals.  Dried beans and soy products.  Starchy vegetables, such as potatoes, peas, and corn.  Fruit and fruit juices.  Milk and yogurt.  Sweets and snack foods, such as cake, cookies, candy, chips, and soft drinks. How do I count carbohydrates? There are two ways to count carbohydrates in food. You can use either of the methods or a combination of both. Reading "Nutrition Facts" on packaged food The "Nutrition Facts" list is included on the labels of almost all packaged foods and beverages in the [place] It includes:  The serving size.  Information about nutrients in each serving, including the grams (g) of carbohydrate per serving. To use the "Nutrition Facts":  Decide how many servings you will have.  Multiply the number of servings by the number of carbohydrates per serving.  The resulting number is the total amount of carbohydrates that you will be having. Learning standard serving sizes of other foods When you eat carbohydrate foods that are not packaged or do not include "Nutrition Facts" on the label, you need to measure the servings in order to count the amount of carbohydrates:  Measure the foods that you will eat with a food scale or measuring cup, if needed.  Decide how many standard-size servings you will eat.  Multiply the number of servings by 15. Most carbohydrate-rich foods have about 15 g of carbohydrates per serving. ? For example, if you eat 8 oz (170 g) of strawberries, you will have eaten 2 servings and 30 g of  carbohydrates (2 servings x 15 g = 30 g).  For foods that have more  than one food mixed, such as soups and casseroles, you must count the carbohydrates in each food that is included. The following list contains standard serving sizes of common carbohydrate-rich foods. Each of these servings has about 15 g of carbohydrates:   hamburger bun or  English muffin.   oz (15 mL) syrup.   oz (14 g) jelly.  1 slice of bread.  1 six-inch tortilla.  3 oz (85 g) cooked rice or pasta.  4 oz (113 g) cooked dried beans.  4 oz (113 g) starchy vegetable, such as peas, corn, or potatoes.  4 oz (113 g) hot cereal.  4 oz (113 g) mashed potatoes or  of a large baked potato.  4 oz (113 g) canned or frozen fruit.  4 oz (120 mL) fruit juice.  4-6 crackers.  6 chicken nuggets.  6 oz (170 g) unsweetened dry cereal.  6 oz (170 g) plain fat-free yogurt or yogurt sweetened with artificial sweeteners.  8 oz (240 mL) milk.  8 oz (170 g) fresh fruit or one small piece of fruit.  24 oz (680 g) popped popcorn. Example of carbohydrate counting Sample meal  3 oz (85 g) chicken breast.  6 oz (170 g) brown rice.  4 oz (113 g) corn.  8 oz (240 mL) milk.  8 oz (170 g) strawberries with sugar-free whipped topping. Carbohydrate calculation 1. Identify the foods that contain carbohydrates: ? Rice. ? Corn. ? Milk. ? Strawberries. 2. Calculate how many servings you have of each food: ? 2 servings rice. ? 1 serving corn. ? 1 serving milk. ? 1 serving strawberries. 3. Multiply each number of servings by 15 g: ? 2 servings rice x 15 g = 30 g. ? 1 serving corn x 15 g = 15 g. ? 1 serving milk x 15 g = 15 g. ? 1 serving strawberries x 15 g = 15 g. 4. Add together all of the amounts to find the total grams of carbohydrates eaten: ? 30 g + 15 g + 15 g + 15 g = 75 g of carbohydrates total. Summary  Carbohydrate counting is a method of keeping track of how many carbohydrates you eat.  Eating  carbohydrates naturally increases the amount of sugar (glucose) in the blood.  Counting how many carbohydrates you eat helps keep your blood glucose within normal limits, which helps you manage your diabetes.  A diet and nutrition specialist (registered dietitian) can help you make a meal plan and calculate how many carbohydrates you should have at each meal and snack. This information is not intended to replace advice given to you by your health care provider. Make sure you discuss any questions you have with your health care provider. Document Released: 02/05/2005 Document Revised: 08/15/2016 Document Reviewed: 07/20/2015 Elsevier Interactive Patient Education  2019 [employer].

## 2018-08-06 NOTE — Telephone Encounter (Signed)
[facility] faxed refill request for the following medications:   Trulicity 0.52 MG / 5.9TG SDP 4x0.5ML   Please advise.  Thanks, [employer]

## 2018-08-07 NOTE — Telephone Encounter (Signed)
Need PA done

## 2018-08-08 ENCOUNTER — Telehealth: Payer: Self-pay | Admitting: Physician Assistant

## 2018-08-08 DIAGNOSIS — R809 Proteinuria, unspecified: Secondary | ICD-10-CM

## 2018-08-08 DIAGNOSIS — E1129 Type 2 diabetes mellitus with other diabetic kidney complication: Secondary | ICD-10-CM

## 2018-08-08 MED ORDER — NOVOFINE 32G X 6 MM MISC: 0 refills | Status: DC

## 2018-08-08 MED ORDER — OZEMPIC (0.25 OR 0.5 MG/DOSE) 2 MG/1.5ML ~~LOC~~ SOPN: 0.5000 mg | PEN_INJECTOR | SUBCUTANEOUS | 1 refills | Status: DC

## 2018-08-08 NOTE — Telephone Encounter (Signed)
What are the formulary drugs.

## 2018-08-08 NOTE — Telephone Encounter (Signed)
Sent in ozempic. It is a once weekly injection. We will start at 0.25 mg subQ. It is a reusable pen so I have to order separate needles.

## 2018-08-08 NOTE — Telephone Encounter (Signed)
victoza or ozempic

## 2018-08-08 NOTE — Telephone Encounter (Signed)
Alan George with [employer] called saying the Trulicity was not approved.  It is not on the Formulary for this patient.  They need to have tried all the other drugs that are on formulary first.  [name]

## 2018-08-11 ENCOUNTER — Other Ambulatory Visit: Payer: Self-pay

## 2018-08-11 ENCOUNTER — Ambulatory Visit: Payer: Medicare Other | Admitting: Pharmacist

## 2018-08-11 DIAGNOSIS — E1129 Type 2 diabetes mellitus with other diabetic kidney complication: Secondary | ICD-10-CM

## 2018-08-11 DIAGNOSIS — J45909 Unspecified asthma, uncomplicated: Secondary | ICD-10-CM

## 2018-08-11 DIAGNOSIS — Z794 Long term (current) use of insulin: Secondary | ICD-10-CM

## 2018-08-11 NOTE — Telephone Encounter (Signed)
Left detailed message.   

## 2018-08-11 NOTE — Patient Instructions (Signed)
Goals Addressed            This Visit's Progress   . "We are getting close to the donut hole" (pt-stated)       Current Barriers:  . financial  Pharmacist Clinical Goal(s): Over the next 14 days, Mr.. Tassinari will provide the necessary supplementary documents (proof of out of pocket prescription expenditure, proof of household income) needed for medication assistance applications to CCM pharmacist.   Interventions: . CCM pharmacist will apply for medication assistance program for BREO made by [employer], Lantus made by [employer], and pending insurance approval and prescription by [name] made by [employer]  Patient Self Care Activities:  [name] Gather necessary documents needed to apply for medication assistance  Initial goal documentation     . How do I know what Medicare plan to pick in October (pt-stated)       Current Barriers:  . Overwhelmed by information and advertisments during open enrollment  Pharmacist Clinical Goal(s):  [name] During open enrollment 2020, patient and wife will utilize [facility]) to determine best insurance plan for Mr. Brevik.   Interventions: . Pharmacist provided information for [employer] and [place] Information Program ([address] [place]  [address] o [phone] or toll free [phone]   Patient Self Care Activities:  . Contact [facility] during open enrollment . Utilize [facility] website for more information . Volunteer- not selling anything!  Initial goal documentation     . per wife "He needs some paper work for one of his medicines" (pt-stated)       Current Barriers:  . Understanding insurance barrier to prescribed medication  Pharmacist Clinical Goal(s):  [name] Over the next 14 days, figure out which medication has the "paperwork issue" and let CCM pharmacist know.  o This could be  a "prior authroization", a "tier exception", or "quantity limit"  Interventions: . Counseling on insurance terminology such as PA, tier exception, quantity limit  . Unlikely there tere is medication assistance program for a generic medication  Patient Self Care Activities:  . Please figure out which medication this is and who is the prescriber  Initial goal documentation         Thank you for taking the time to speak with me today!   Please call a member of the CCM (Chronic Care Management) Team with any questions or case management needs:   Vanetta Mulders, BSN Nurse Care Coordinator  [phone]  [name], PharmD  Clinical Pharmacist  [phone]  Elliot Gurney, [place] Clinical Social Worker [phone]

## 2018-08-11 NOTE — Chronic Care Management (AMB) (Signed)
Chronic Care Management   Note  08/11/2018 Name: Alan George. MRN: [id number] DOB: 03-26-1955  Subjective:  CCM clinical pharmacist received internal referral of CCM patient from [facility] for medication assistance. Medication reconciliation completed by Trish Fountain, reviewed by Alan George PharmD.     Objective: Lab Results  Component Value Date   CREATININE 0.91 12/05/2017   CREATININE 1.08 05/14/2017   CREATININE 1.02 07/03/2016    Lab Results  Component Value Date   HGBA1C 9.2 (A) 07/10/2018    Lipid Panel     Component Value Date/Time   CHOL 214 (H) 12/05/2017 0939   TRIG 143 12/05/2017 0939   HDL 51 12/05/2017 0939   CHOLHDL 4.2 12/05/2017 0939   CHOLHDL 2.9 12/19/2016 0950   VLDL 25 07/03/2016 0938   LDLCALC 134 (H) 12/05/2017 0939   LDLCALC 68 12/19/2016 0950    BP Readings from Last 3 Encounters:  07/10/18 (!) 142/81  03/13/18 121/75  12/05/17 130/90    Allergies  Allergen Reactions  . Dog Epithelium   . Dust Mite Extract   . Pollen Extract   . Tree Extract   . Iodinated Diagnostic Agents Hives    ?gadolinium-thinks it was after MRI  . Other Rash    Medications Reviewed Today    Reviewed by Alan George, [facility] (Pharmacist) on 08/11/18 at [place] List Status: <None>  Medication Order Taking? Sig Documenting Provider Last Dose Status Informant  albuterol (PROAIR HFA) 108 (90 Base) MCG/ACT inhaler [id number] No Inhale 2 puffs into the lungs every 4 (four) hours as needed for wheezing or shortness of breath. Alan Post, PA-C Taking Active   atorvastatin (LIPITOR) 80 MG tablet [id number] No TAKE 1 TABLET(80 MG) BY MOUTH DAILY Alan Post, PA-C Taking Active   BREO ELLIPTA 100-25 MCG/INH AEPB [id number] No INHALE 1 PUFF INTO THE LUNGS DAILY Alan Post, PA-C Taking Active   cyclobenzaprine (FLEXERIL) 10 MG tablet [id number] No Take by mouth. [provider] Taking Active            Med Note Alan George, Alan George    Tue Oct 19, 2014 10:42 AM) Received from: [place] (TRULICITY) 5.36 UY/4.0HK SOPN [id number] No Inject 0.75 mg into the skin once a week. Alan Post, PA-C Taking Active   HYDROmorphone (DILAUDID) 4 MG tablet [id number] No take 0.5 tablet by mouth every 4 hours if needed [provider] Taking Active            Med Note Alan George, Alan George   Tue Oct 19, 2014 10:42 AM) Received from: External Pharmacy  Insulin Glargine (LANTUS SOLOSTAR) 100 UNIT/ML Solostar Pen [id number] No Inject 30 Units into the skin daily. Alan Post, PA-C Taking Active   Insulin Pen Needle (NOVOFINE) 32G X 6 MM MISC [id number]  Use 1 needle per once weekly ozempic injection. Alan Post, PA-C  Active   losartan (COZAAR) 50 MG tablet [id number] No TAKE 1 TABLET(50 MG) BY MOUTH DAILY Alan Post, PA-C Taking Active   Melatonin 1 MG TABS [id number] No Take by mouth. [provider] Taking Active            Med Note Alan George, Alan George   Tue Oct 19, 2014 10:42 AM) Received from: [facility]  metFORMIN (GLUCOPHAGE) 1000 MG tablet [id number] No TAKE 1 TABLET(1000 MG) BY MOUTH TWICE DAILY WITH A MEAL Alan Post, PA-C Taking Active  mirtazapine (REMERON) 30 MG tablet [id number] No Take 1 tablet (30 mg total) by mouth at bedtime. Alan George, Alan Co., MD Taking Active   NARCAN 4 MG/0.1ML LIQD nasal spray kit [id number] No  [provider] Not Taking Active            Med Note Rollene George, Alan George Aug 06, 2018  8:44 AM) Available if needed  omeprazole (PRILOSEC) 20 MG capsule [id number] No TAKE 1 CAPSULE(20 MG) BY MOUTH DAILY Alan George, Alan M, PA-C Taking Active   Semaglutide,0.25 or 0.5MG/DOS, (OZEMPIC, 0.25 OR 0.5 MG/DOSE,) 2 MG/1.5ML SOPN [id number]  Inject 0.5 mg into the skin once a week. Alan Post, PA-C  Active   sertraline (ZOLOFT) 100 MG tablet [id number] No Take 2 tablets (200 mg total) by mouth daily. Alan George, Alan Co., MD  Taking Active            Assessment:   Goals Addressed            This Visit's Progress   . "We are getting close to the donut hole" (pt-stated)       Current Barriers:  . financial  Pharmacist Clinical Goal(s): Over the next 14 days, Alan George will provide the necessary supplementary documents (proof of out of pocket prescription expenditure, proof of household income) needed for medication assistance applications to CCM pharmacist.   Interventions: . CCM pharmacist will apply for medication assistance program for BREO made by [place], Lantus made by [employer], and pending insurance approval and prescription by [name] made by [employer]  Patient Self Care Activities:  [name] Gather necessary documents needed to apply for medication assistance  Initial goal documentation     . How do I know what Medicare plan to pick in October (pt-stated)       Current Barriers:  . Overwhelmed by information and advertisments during open enrollment  Pharmacist Clinical Goal(s):  [name] During open enrollment 2020, patient and wife will utilize [facility]) to determine best insurance plan for Alan George.   Interventions: . Pharmacist provided information for [employer] and [place] Information Program ([place] [address] [place]  [address] o [phone] or toll free [phone]   Patient Self Care Activities:  . Contact [employer] during open enrollment . Utilize [facility] website for more information . Volunteer- not selling anything!  Initial goal documentation     . per wife "He needs some paper work for one of his medicines" (pt-stated)       Current Barriers:  . Understanding insurance barrier to prescribed medication  Pharmacist Clinical Goal(s):  [name] Over the next 14 days, figure out which medication has the "paperwork issue"  and let CCM pharmacist know.  o This could be a "prior authroization", a "tier exception", or "quantity limit"  Interventions: . Counseling on insurance terminology such as PA, tier exception, quantity limit  . Unlikely there tere is medication assistance program for a generic medication  Patient Self Care Activities:  . Please figure out which medication this is and who is the prescriber  Initial goal documentation        Plan: Recommendations discussed with provider -risks of serotonin syndrome: mirtazapine, sertraline, and cyclobenzaprine   Recommendations discussed with patient - please gather the paperwork we discussed for medication applicatoins  Follow up: Telephone follow up appointment with care  management team member scheduled for: 2 weeks with PharmD   Alan George, PharmD Clinical Pharmacist [place] [phone]

## 2018-08-13 ENCOUNTER — Ambulatory Visit: Payer: Self-pay | Admitting: Physician Assistant

## 2018-08-26 ENCOUNTER — Ambulatory Visit: Payer: Medicare Other | Admitting: Psychiatry

## 2018-08-28 ENCOUNTER — Other Ambulatory Visit: Payer: Self-pay

## 2018-08-28 ENCOUNTER — Ambulatory Visit: Payer: Medicare Other

## 2018-08-28 ENCOUNTER — Other Ambulatory Visit: Payer: Self-pay | Admitting: Physician Assistant

## 2018-08-28 DIAGNOSIS — E785 Hyperlipidemia, unspecified: Secondary | ICD-10-CM

## 2018-08-28 DIAGNOSIS — E1169 Type 2 diabetes mellitus with other specified complication: Secondary | ICD-10-CM

## 2018-08-28 DIAGNOSIS — R809 Proteinuria, unspecified: Secondary | ICD-10-CM

## 2018-08-28 DIAGNOSIS — E1129 Type 2 diabetes mellitus with other diabetic kidney complication: Secondary | ICD-10-CM

## 2018-08-28 NOTE — Patient Instructions (Signed)
Thank you allowing the Chronic Care Management Team to be a part of your care! It was a pleasure speaking with you today!  1. Continue to take your medications as prescribed 2. Continue to work on [employer], food label reading, and carbohydrate reduction. YOU ARE DOING A FANTASTIC JOB!! 3. You can reduce your blood sugar checks to every morning before breakfast and a couple of times during the week 2 hours after your evening meal.  CCM (Chronic Care Management) Team   Trish Fountain RN, BSN Nurse Care Coordinator  [phone]  [name] PharmD  Clinical Pharmacist  [phone]   [place], [place] Social Worker [phone]  Goals Addressed            This Visit's Progress   . per wife "We really need to get his sugars under control because they are in the 200s" (pt-stated)       Current Barriers:  [name] Knowledge Deficits related to basic Diabetes pathophysiology and self care/management . Financial strain (medication cost once in doughnut hole) . Sleep schedule  Nurse Case Manager Clinical Goal(s):  [name] Over the next 14 days, patient will demonstrate improved adherence to prescribed treatment plan for diabetes self care/management as evidenced by checking blood glucose levels as prescribed, adhering to ADA/low carb diet, daily exercise, and medication adherence-goal met 08/28/2018 . Over the next 30 days, patient will continue to demonstrate improved adherence to prescribed treatment plan for diabetes self care management.  Interventions:  . Reviewed medications and confirmed patient was switched from Trulicity to [place] . Discussed blood sugar readings and praised patient for his efforts to decrease his blood sugars . Assessed for additional questions regarding DM self care . Reviewed scheduled/upcoming provider appointments including: PCP 08/29/2018 . Advised patient/wife to decrease blood sugar checks to daily fasting and post prandial 2 x week to decrease  patient frustration with blood sugar checks . Assessed for engagement with CCM Clinic Pharmacist  Patient Self Care Activities:  . Self administers medications as prescribed . Attends all scheduled provider appointments . Checks blood sugars as prescribed and utilize hyper and hypoglycemia protocol as needed . Adhere to ADA diet as discussed  Plan:  . RNCM will provide educational materials including Advanced Directives document via postal mail and email   Initial goal documentation        The patient verbalized understanding of instructions provided today and declined a print copy of patient instruction materials.   Telephone follow up appointment with care management team member scheduled for: 1 month  SYMPTOMS OF A STROKE   You have any symptoms of stroke. "BE FAST" is an easy way to remember the main warning signs: ? B - Balance. Signs are dizziness, sudden trouble walking, or loss of balance. ? E - Eyes. Signs are trouble seeing or a sudden change in how you see. ? F - Face. Signs are sudden weakness or loss of feeling of the face, or the face or eyelid drooping on one side. ? A - Arms. Signs are weakness or loss of feeling in an arm. This happens suddenly and usually on one side of the body. ? S - Speech. Signs are sudden trouble speaking, slurred speech, or trouble understanding what people say. ? T - Time. Time to call emergency services. Write down what time symptoms started.  You have other signs of stroke, such as: ? A sudden, very bad headache with no known cause. ? Feeling sick to your stomach (nausea). ?  Throwing up (vomiting). ? Jerky movements you cannot control (seizure).  SYMPTOMS OF A HEART ATTACK  What are the signs or symptoms? Symptoms of this condition include:  Chest pain. It may feel like: ? Crushing or squeezing. ? Tightness, pressure, fullness, or heaviness.  Pain in the arm, neck, jaw, back, or upper body.  Shortness of  breath.  Heartburn.  Indigestion.  Nausea.  Cold sweats.  Feeling tired.  Sudden lightheadedness.

## 2018-08-28 NOTE — Chronic Care Management (AMB) (Signed)
Chronic Care Management   Follow Up Note   08/28/2018 Name: Alan George. MRN: [id number] DOB: 02/17/1956  Referred by: Alan Post, PA-C Reason for referral : Chronic Care Management (follow up DM)   Subjective: per wife "His numbers have come down a lot because he is focused on carbs and not sugars and calories"   Objective:  Lab Results  Component Value Date   HGBA1C 9.2 (A) 07/10/2018   HGBA1C 10.3 (A) 03/13/2018   HGBA1C 7.8 (A) 12/05/2017   Lab Results  Component Value Date   MICROALBUR 6.7 12/19/2016   LDLCALC 134 (H) 12/05/2017   CREATININE 0.91 12/05/2017    Assessment: Alan Georgeis a 63year old [place], PA-Cfor primary care. Ms. Alan George the CCM team to consult the patient forcare management secondary to medication affordabilityand DM management. Telephone outreach today to patient/patient wife to assess for progression towards health goals.  Review of patient status, including review of consultants reports, relevant laboratory and other test results, and collaboration with appropriate care team members and the patient's provider was performed as part of comprehensive patient evaluation and provision of chronic care management services.    Goals Addressed            This Visit's Progress   . per wife "We really need to get his sugars under control because they are in the 200s" (pt-stated)       Per wife, patient is doing much better with his diet and willingness to manage his diabetes. Wife states patient has only had 2 blood sugar readings >200 since they have started counting carbohydrates. His average fasting blood sugars are in the 140s. This is a significant improvement for Alan George. He has recently been switched from Trulicity to [employer] secondary to insurance coverage and cost. He is taking his medications as prescribed. He is unable to exercise secondary to chronic pain.  Current Barriers:  Alan George Kitchen Knowledge Deficits  related to basic Diabetes pathophysiology and self care/management . Financial strain (medication cost once in doughnut hole) . Sleep schedule  Nurse Case Manager Clinical Goal(s):  Alan George Kitchen Over the next 14 days, patient will demonstrate improved adherence to prescribed treatment plan for diabetes self care/management as evidenced by checking blood glucose levels as prescribed, adhering to ADA/low carb diet, daily exercise, and medication adherence-goal met 08/28/2018 . Over the next 30 days, patient will continue to demonstrate improved adherence to prescribed treatment plan for diabetes self care management.  Interventions:  . Reviewed medications and confirmed patient was switched from Trulicity to [place] . Discussed blood sugar readings and praised patient for his efforts to decrease his blood sugars . Assessed for additional questions regarding DM self care . Reviewed scheduled/upcoming provider appointments including: PCP 08/29/2018 . Advised patient/wife to decrease blood sugar checks to daily fasting and George prandial 2 x week to decrease patient frustration with blood sugar checks . Assessed for engagement with CCM Clinic Pharmacist  Patient Self Care Activities:  . Self administers medications as prescribed . Attends all scheduled provider appointments . Checks blood sugars as prescribed and utilize hyper and hypoglycemia protocol as needed . Adhere to ADA diet as discussed  Plan:  . RNCM will provide educational materials including Advanced Directives document via postal mail and email   Initial goal documentation         Telephone follow up appointment with care management team member scheduled for: 30 days 09/30/2018 at 8:30  Avanell Banwart E. Rollene Rotunda, RN, BSN Nurse Care Coordinator  [facility] [phone]

## 2018-08-29 ENCOUNTER — Ambulatory Visit: Payer: Medicare Other | Admitting: Physician Assistant

## 2018-08-29 VITALS — BP 138/88 | HR 102 | Temp 98.7°F | Resp 16 | Wt 240.0 lb

## 2018-08-29 DIAGNOSIS — E1169 Type 2 diabetes mellitus with other specified complication: Secondary | ICD-10-CM

## 2018-08-29 DIAGNOSIS — R809 Proteinuria, unspecified: Secondary | ICD-10-CM | POA: Diagnosis not present

## 2018-08-29 DIAGNOSIS — I10 Essential (primary) hypertension: Secondary | ICD-10-CM

## 2018-08-29 DIAGNOSIS — E785 Hyperlipidemia, unspecified: Secondary | ICD-10-CM

## 2018-08-29 DIAGNOSIS — Z794 Long term (current) use of insulin: Secondary | ICD-10-CM | POA: Diagnosis not present

## 2018-08-29 DIAGNOSIS — E1129 Type 2 diabetes mellitus with other diabetic kidney complication: Secondary | ICD-10-CM

## 2018-08-29 LAB — POCT GLYCOSYLATED HEMOGLOBIN (HGB A1C)
Est. average glucose Bld gHb Est-mCnc: 183
Hemoglobin A1C: 8 % — AB (ref 4.0–5.6)

## 2018-08-29 MED ORDER — CONTOUR NEXT TEST VI STRP: ORAL_STRIP | 12 refills | Status: DC

## 2018-08-29 NOTE — Progress Notes (Signed)
Patient: Alan George. Male    DOB: [birth year]   63 y.o.   MRN: [id number] Visit Date: 09/03/2018  Today's Provider: Trinna Post, PA-C   Chief Complaint  Patient presents with  . Follow-up   Subjective:     HPI  Diabetes Mellitus Type II, Follow-up:   Lab Results  Component Value Date   HGBA1C 8.0 (A) 08/29/2018   HGBA1C 9.2 (A) 07/10/2018   HGBA1C 10.3 (A) 03/13/2018    Last seen for diabetes 3 months ago.  Management since then includes starting Ozempic. Initially trulicity was prescribed but was not approved by insurance. He is taking 0.5 mg subQ once weekly and has started it two weeks ago due to delays with insurance. Also he has been meeting with our CCM team and focusing on diet and carb counting . He reports excellent compliance with treatment. He is not having side effects.  Current symptoms include none and have been stable. Home blood sugar records: fasting range: 140's  Episodes of hypoglycemia? no   Current insulin regiment: as directed Most Recent Eye Exam:  Weight trend: stable Prior visit with dietician: No Current exercise: none Current diet habits: not asked  HLD: Currently taking lipitor 80 mg. Labs due in 11/2018.  Pertinent Labs:    Component Value Date/Time   CHOL 214 (H) 12/05/2017 0939   TRIG 143 12/05/2017 0939   HDL 51 12/05/2017 0939   LDLCALC 134 (H) 12/05/2017 0939   LDLCALC 68 12/19/2016 0950   CREATININE 0.91 12/05/2017 0939   CREATININE 1.02 07/03/2016 0938    Wt Readings from Last 3 Encounters:  08/29/18 240 lb (108.9 kg)  07/10/18 243 lb 9.6 oz (110.5 kg)  03/13/18 248 lb (112.5 kg)   HTN: Currently taking losartan 50 mg QD.   BP Readings from Last 3 Encounters:  08/29/18 138/88  07/10/18 (!) 142/81  03/13/18 121/75    ------------------------------------------------------------------------  Allergies  Allergen Reactions  . Dog Epithelium   . Dust Mite Extract   . Pollen Extract   . Tree  Extract   . Iodinated Diagnostic Agents Hives    ?gadolinium-thinks it was after MRI  . Other Rash     Current Outpatient Medications:  .  albuterol (PROAIR HFA) 108 (90 Base) MCG/ACT inhaler, Inhale 2 puffs into the lungs every 4 (four) hours as needed for wheezing or shortness of breath., Disp: 1 Inhaler, Rfl: 5 .  atorvastatin (LIPITOR) 80 MG tablet, TAKE 1 TABLET(80 MG) BY MOUTH DAILY, Disp: 90 tablet, Rfl: 0 .  BREO ELLIPTA 100-25 MCG/INH AEPB, INHALE 1 PUFF INTO THE LUNGS DAILY, Disp: 60 each, Rfl: 1 .  cyclobenzaprine (FLEXERIL) 10 MG tablet, Take by mouth., Disp: , Rfl:  .  HYDROmorphone (DILAUDID) 4 MG tablet, take 0.5 tablet by mouth every 4 hours if needed, Disp: , Rfl: 0 .  Insulin Glargine (LANTUS SOLOSTAR) 100 UNIT/ML Solostar Pen, Inject 30 Units into the skin daily., Disp: 30 mL, Rfl: 1 .  Insulin Pen Needle (NOVOFINE) 32G X 6 MM MISC, Use 1 needle per once weekly ozempic injection., Disp: 50 each, Rfl: 0 .  losartan (COZAAR) 50 MG tablet, TAKE 1 TABLET(50 MG) BY MOUTH DAILY, Disp: 90 tablet, Rfl: 1 .  Melatonin 1 MG TABS, Take by mouth., Disp: , Rfl:  .  metFORMIN (GLUCOPHAGE) 1000 MG tablet, TAKE 1 TABLET(1000 MG) BY MOUTH TWICE DAILY WITH A MEAL, Disp: 180 tablet, Rfl: 0 .  mirtazapine (REMERON) 30 MG  tablet, Take 1 tablet (30 mg total) by mouth at bedtime., Disp: 30 tablet, Rfl: 1 .  NARCAN 4 MG/0.1ML LIQD nasal spray kit, , Disp: , Rfl:  .  omeprazole (PRILOSEC) 20 MG capsule, TAKE 1 CAPSULE(20 MG) BY MOUTH DAILY, Disp: 90 capsule, Rfl: 1 .  Semaglutide,0.25 or 0.5MG/DOS, (OZEMPIC, 0.25 OR 0.5 MG/DOSE,) 2 MG/1.5ML SOPN, Inject 0.5 mg into the skin once a week., Disp: 4 pen, Rfl: 1 .  sertraline (ZOLOFT) 100 MG tablet, Take 2 tablets (200 mg total) by mouth daily., Disp: 60 tablet, Rfl: 1 .  glucose blood (CONTOUR NEXT TEST) test strip, Check blood sugar once daily, Disp: 100 each, Rfl: 12  Review of Systems  Constitutional: Negative.   Eyes: Negative.     Cardiovascular: Negative.   Endocrine: Negative.     Social History   Tobacco Use  . Smoking status: Former Research scientist (life sciences)  . Smokeless tobacco: Never Used  Substance Use Topics  . Alcohol use: No    Alcohol/week: 0.0 standard drinks      Objective:   BP 138/88 (BP Location: Right Arm, Patient Position: Sitting, Cuff Size: Normal)   Pulse (!) 102   Temp 98.7 F (37.1 C) (Oral)   Resp 16   Wt 240 lb (108.9 kg)   BMI 32.55 kg/m  Vitals:   08/29/18 0830  BP: 138/88  Pulse: (!) 102  Resp: 16  Temp: 98.7 F (37.1 C)  TempSrc: Oral  Weight: 240 lb (108.9 kg)     Physical Exam Constitutional:      Appearance: Normal appearance.  Cardiovascular:     Rate and Rhythm: Normal rate and regular rhythm.     Heart sounds: Normal heart sounds.  Pulmonary:     Effort: Pulmonary effort is normal.     Breath sounds: Normal breath sounds.  Skin:    General: Skin is warm and dry.  Neurological:     Mental Status: He is alert.  Psychiatric:        Mood and Affect: Mood normal.        Behavior: Behavior normal.      Results for orders placed or performed in visit on 08/29/18  POCT glycosylated hemoglobin (Hb A1C)  Result Value Ref Range   Hemoglobin A1C 8.0 (A) 4.0 - 5.6 %   Est. average glucose Bld gHb Est-mCnc 183        Assessment & Plan    1. Type 2 diabetes mellitus with microalbuminuria, with long-term current use of insulin (HCC)  Great improvement with his A1c with diet as he has only started ozempic in the past 2 weeks. Suspect this will continue to improve as Ozempic starts to work. Continue current medications.   - POCT glycosylated hemoglobin (Hb A1C) - glucose blood (CONTOUR NEXT TEST) test strip; Check blood sugar once daily  Dispense: 100 each; Refill: 12  2. Hyperlipidemia associated with type 2 diabetes mellitus (HCC)  Continue lipitor 80 mg, labs at next visit.   3. Essential hypertension  Continue losartan 50 mg QD.   The entirety of the  information documented in the History of Present Illness, Review of Systems and Physical Exam were personally obtained by me. Portions of this information were initially documented by Lynford Humphrey, CMA and reviewed by me for thoroughness and accuracy.   F/u 3 months for HTN, HLD, DM      Trinna Post, PA-C  [facility]

## 2018-09-01 ENCOUNTER — Ambulatory Visit: Payer: Self-pay | Admitting: Pharmacist

## 2018-09-01 ENCOUNTER — Telehealth: Payer: Self-pay

## 2018-09-01 NOTE — Chronic Care Management (AMB) (Signed)
  Chronic Care Management   Note  09/01/2018 Name: Alan George. MRN: [id number] DOB: 09-15-1955  Subjective:   Chronic Care Management   Note  09/01/2018 Name: Alan George. MRN: [id number] DOB: Jun 15, 1955  63 y.o. year old male referred to CCM clinical pharmacist by [facility] Trish Fountain for medication assistance [place]), Pharmacist checking on status of application paperwork and "insurance denial".    Was unable to reach patient via telephone today and have left HIPAA compliant voicemail asking patient to return my call. (unsuccessful outreach #1)    Follow up: The care management team will reach out to the patient again over the next 5-7 days.     [name], PharmD Clinical Pharmacist [place] [phone]

## 2018-09-03 ENCOUNTER — Encounter: Payer: Self-pay | Admitting: Psychiatry

## 2018-09-03 ENCOUNTER — Other Ambulatory Visit: Payer: Self-pay

## 2018-09-03 ENCOUNTER — Ambulatory Visit: Payer: Medicare Other | Admitting: Psychiatry

## 2018-09-03 DIAGNOSIS — G4700 Insomnia, unspecified: Secondary | ICD-10-CM

## 2018-09-03 DIAGNOSIS — F429 Obsessive-compulsive disorder, unspecified: Secondary | ICD-10-CM

## 2018-09-03 DIAGNOSIS — F329 Major depressive disorder, single episode, unspecified: Secondary | ICD-10-CM | POA: Diagnosis not present

## 2018-09-03 MED ORDER — MIRTAZAPINE 30 MG PO TABS: 30.0000 mg | ORAL_TABLET | Freq: Every day | ORAL | 1 refills | Status: DC

## 2018-09-03 MED ORDER — SERTRALINE HCL 100 MG PO TABS: 200.0000 mg | ORAL_TABLET | Freq: Every day | ORAL | 1 refills | Status: DC
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
## 2018-09-03 NOTE — Patient Instructions (Signed)
Diabetes Mellitus and Exercise Exercising regularly is important for your overall health, especially when you have diabetes (diabetes mellitus). Exercising is not only about losing weight. It has many other health benefits, such as increasing muscle strength and bone density and reducing body fat and stress. This leads to improved fitness, flexibility, and endurance, all of which result in better overall health. Exercise has additional benefits for people with diabetes, including:  Reducing appetite.  Helping to lower and control blood glucose.  Lowering blood pressure.  Helping to control amounts of fatty substances (lipids) in the blood, such as cholesterol and triglycerides.  Helping the body to respond better to insulin (improving insulin sensitivity).  Reducing how much insulin the body needs.  Decreasing the risk for heart disease by: ? Lowering cholesterol and triglyceride levels. ? Increasing the levels of good cholesterol. ? Lowering blood glucose levels. What is my activity plan? Your health care provider or certified diabetes educator can help you make a plan for the type and frequency of exercise (activity plan) that works for you. Make sure that you:  Do at least 150 minutes of moderate-intensity or vigorous-intensity exercise each week. This could be brisk walking, biking, or water aerobics. ? Do stretching and strength exercises, such as yoga or weightlifting, at least 2 times a week. ? Spread out your activity over at least 3 days of the week.  Get some form of physical activity every day. ? Do not go more than 2 days in a row without some kind of physical activity. ? Avoid being inactive for more than 30 minutes at a time. Take frequent breaks to walk or stretch.  Choose a type of exercise or activity that you enjoy, and set realistic goals.  Start slowly, and gradually increase the intensity of your exercise over time. What do I need to know about managing my  diabetes?   Check your blood glucose before and after exercising. ? If your blood glucose is 240 mg/dL (13.3 mmol/L) or higher before you exercise, check your urine for ketones. If you have ketones in your urine, do not exercise until your blood glucose returns to normal. ? If your blood glucose is 100 mg/dL (5.6 mmol/L) or lower, eat a snack containing 15-20 grams of carbohydrate. Check your blood glucose 15 minutes after the snack to make sure that your level is above 100 mg/dL (5.6 mmol/L) before you start your exercise.  Know the symptoms of low blood glucose (hypoglycemia) and how to treat it. Your risk for hypoglycemia increases during and after exercise. Common symptoms of hypoglycemia can include: ? Hunger. ? Anxiety. ? Sweating and feeling clammy. ? Confusion. ? Dizziness or feeling light-headed. ? Increased heart rate or palpitations. ? Blurry vision. ? Tingling or numbness around the mouth, lips, or tongue. ? Tremors or shakes. ? Irritability.  Keep a rapid-acting carbohydrate snack available before, during, and after exercise to help prevent or treat hypoglycemia.  Avoid injecting insulin into areas of the body that are going to be exercised. For example, avoid injecting insulin into: ? The arms, when playing tennis. ? The legs, when jogging.  Keep records of your exercise habits. Doing this can help you and your health care provider adjust your diabetes management plan as needed. Write down: ? Food that you eat before and after you exercise. ? Blood glucose levels before and after you exercise. ? The type and amount of exercise you have done. ? When your insulin is expected to peak, if you use   insulin. Avoid exercising at times when your insulin is peaking.  When you start a new exercise or activity, work with your health care provider to make sure the activity is safe for you, and to adjust your insulin, medicines, or food intake as needed.  Drink plenty of water while  you exercise to prevent dehydration or heat stroke. Drink enough fluid to keep your urine clear or pale yellow. Summary  Exercising regularly is important for your overall health, especially when you have diabetes (diabetes mellitus).  Exercising has many health benefits, such as increasing muscle strength and bone density and reducing body fat and stress.  Your health care provider or certified diabetes educator can help you make a plan for the type and frequency of exercise (activity plan) that works for you.  When you start a new exercise or activity, work with your health care provider to make sure the activity is safe for you, and to adjust your insulin, medicines, or food intake as needed. This information is not intended to replace advice given to you by your health care provider. Make sure you discuss any questions you have with your health care provider. Document Released: 04/28/2003 Document Revised: 08/30/2016 Document Reviewed: 07/18/2015 Elsevier Patient Education  2020 Elsevier Inc.  

## 2018-09-05 ENCOUNTER — Ambulatory Visit: Payer: Self-pay | Admitting: Pharmacist

## 2018-09-05 ENCOUNTER — Telehealth: Payer: Self-pay

## 2018-09-05 NOTE — Chronic Care Management (AMB) (Signed)
   Chronic Care Management   Note  09/05/2018 Name: Alan George. MRN: [id number] DOB: Sep 17, 1955  63 y.o. year old male referred to CCM clinical pharmacist by [facility] Trish Fountain for medication assistance [place]), Pharmacist checking on status of application paperwork and "insurance denial".    Was unable to reach patient via telephone today and have left HIPAA compliant voicemail asking patient to return my call. (unsuccessful outreach #2)    Follow up: The care management team will reach out to the patient again over the next 5-7 days.     [name], PharmD Clinical Pharmacist [place] [phone]

## 2018-09-06 ENCOUNTER — Other Ambulatory Visit: Payer: Self-pay | Admitting: Physician Assistant

## 2018-09-06 DIAGNOSIS — E1129 Type 2 diabetes mellitus with other diabetic kidney complication: Secondary | ICD-10-CM

## 2018-09-06 DIAGNOSIS — R809 Proteinuria, unspecified: Secondary | ICD-10-CM

## 2018-09-11 ENCOUNTER — Ambulatory Visit: Payer: Self-pay | Admitting: Pharmacist

## 2018-09-11 DIAGNOSIS — E1129 Type 2 diabetes mellitus with other diabetic kidney complication: Secondary | ICD-10-CM

## 2018-09-11 DIAGNOSIS — Z794 Long term (current) use of insulin: Secondary | ICD-10-CM

## 2018-09-11 DIAGNOSIS — J45909 Unspecified asthma, uncomplicated: Secondary | ICD-10-CM

## 2018-09-11 NOTE — Chronic Care Management (AMB) (Signed)
  Chronic Care Management   Follow Up Note   09/11/2018 Name: Alan George. MRN: [id number] DOB: August 05, 1955  Subjective Alan George. is a 63 y.o. year old male who is a primary care patient of Trinna Post, [place]. The CCM team was consulted for assistance with chronic disease management and care coordination needs.  Outreach today to Alan George, wife of patient (DPR on file) to assess progression of pharmacy goals. HIPAA identifiers verified.   Assessment: Review of patient status, including review of consultants reports, relevant laboratory and other test results, and collaboration with appropriate care team members and the patient's provider was performed as part of comprehensive patient evaluation and provision of chronic care management services.    Alan George has received and completed Lantus, Ozempic, and Breo applications. Plans to drop off at [facility] on Monday, 7/27.   Goals Addressed            This Visit's Progress   . "We are getting close to the donut hole" (pt-stated)   On track    Current Barriers:  . financial  Pharmacist Clinical Goal(s): Over the next 14 days, Alan George will provide the necessary supplementary documents (proof of out of pocket prescription expenditure, proof of household income) needed for medication assistance applications to CCM pharmacist.   Interventions: . CCM pharmacist will apply for medication assistance program for BREO made by [place], Lantus made by [employer], and pending insurance approval and prescription by [name] made by [employer]ord[employer]  Updated 7/23: Alan George has received and completed applications, plans to return them 09/15/18  Patient Self Care Activities:  [name] Gather necessary documents needed to apply for medication assistance  Please see past updates related to this goal by clicking on the "Past Updates" button in the selected goal          Telephone follow up appointment with care management team member  scheduled for: 2 weeks or sooner pending receipt of applications and response from manufacturers   Alan George, PharmD Clinical Pharmacist [place] [phone]

## 2018-09-17 ENCOUNTER — Ambulatory Visit: Payer: Self-pay | Admitting: Pharmacist

## 2018-09-17 DIAGNOSIS — E1129 Type 2 diabetes mellitus with other diabetic kidney complication: Secondary | ICD-10-CM

## 2018-09-17 DIAGNOSIS — J45909 Unspecified asthma, uncomplicated: Secondary | ICD-10-CM

## 2018-09-17 NOTE — Patient Instructions (Signed)
Goals Addressed            This Visit's Progress   . "We are getting close to the donut hole" (pt-stated)       Current Barriers:  . financial  Pharmacist Clinical Goal(s): Over the next 14 days, Alan George will provide the necessary supplementary documents (proof of out of pocket prescription expenditure, proof of household income) needed for medication assistance applications to CCM pharmacist.   Interventions: . CCM pharmacist will apply for medication assistance program for BREO made by [place], Lantus made by [employer]i, and pending insurance approval and prescription by Alan George made by [employer]ord[employer]  Updated 7/23: Alan George has received and completed applications, plans to return them 09/15/18  Updated 9/93: Completed applications and supplemental documents returned to [facility] pharmacist, updated Alan Collet PA on prescriber portion and prepared application for submission   Patient Self Care Activities:  [name] Gather necessary documents needed to apply for medication assistance  Please see past updates related to this goal by clicking on the "Past Updates" button in the selected goal

## 2018-09-17 NOTE — Chronic Care Management (AMB) (Signed)
  Chronic Care Management   Care Coordination Note  09/17/2018 Name: Isiaah Cuervo. MRN: [id number] DOB: September 24, 1955  Received completed applications and supplemental documents form patient and wife. Prepared applications for submission to each drug manufacturer. Awaiting Adriana's signature on provider portion and then ready to submit.   Goals Addressed            This Visit's Progress   . "We are getting close to the donut hole" (pt-stated)       Current Barriers:  . financial  Pharmacist Clinical Goal(s): Over the next 14 days, Mr.. Yardley will provide the necessary supplementary documents (proof of out of pocket prescription expenditure, proof of household income) needed for medication assistance applications to CCM pharmacist.   Interventions: . CCM pharmacist will apply for medication assistance program for BREO made by [place], Lantus made by [employer], and pending insurance approval and prescription by [name] made by [employer]  Updated 7/23: [name] has received and completed applications, plans to return them 09/15/18  Updated 2/33: Completed applications and supplemental documents returned to [facility] pharmacist, updated Carles Collet PA on prescriber portion and prepared application for submission   Patient Self Care Activities:  [name] Gather necessary documents needed to apply for medication assistance  Please see past updates related to this goal by clicking on the "Past Updates" button in the selected goal          Follow up plan: Telephone follow up appointment with care management team member scheduled for:  1 week to follow up on applications and progression of other pharmacy ccm goals  [name], PharmD Clinical Pharmacist [place] [phone]

## 2018-09-18 ENCOUNTER — Telehealth: Payer: Self-pay

## 2018-09-25 ENCOUNTER — Ambulatory Visit: Payer: Self-pay | Admitting: Pharmacist

## 2018-09-25 ENCOUNTER — Ambulatory Visit (INDEPENDENT_AMBULATORY_CARE_PROVIDER_SITE_OTHER): Payer: Medicare Other

## 2018-09-25 DIAGNOSIS — Z794 Long term (current) use of insulin: Secondary | ICD-10-CM | POA: Diagnosis not present

## 2018-09-25 DIAGNOSIS — R809 Proteinuria, unspecified: Secondary | ICD-10-CM

## 2018-09-25 DIAGNOSIS — E1129 Type 2 diabetes mellitus with other diabetic kidney complication: Secondary | ICD-10-CM

## 2018-09-25 DIAGNOSIS — J45909 Unspecified asthma, uncomplicated: Secondary | ICD-10-CM

## 2018-09-25 NOTE — Chronic Care Management (AMB) (Signed)
  Chronic Care Management   Care Coordination Note  09/25/2018 Name: Alan George. MRN: [id number] DOB: 1956-01-18  Care coordination: Submitted applications for [employer], [employer] (Lantus) and Ozempic (Novonordisk) and uploaded to media tab    Goals Addressed            This Visit's Progress   . "We are getting close to the donut hole" (pt-stated)       Current Barriers:  . financial  Pharmacist Clinical Goal(s): Over the next 14 days, [name] will provide the necessary supplementary documents (proof of out of pocket prescription expenditure, proof of household income) needed for medication assistance applications to CCM pharmacist.   Interventions: . CCM pharmacist will apply for medication assistance program for BREO made by [place], Lantus made by [employer], and pending insurance approval and prescription by [name] made by [employer]  Updated 7/23: [name] has received and completed applications, plans to return them 09/15/18  Updated 8/6: Submitted applications to manufacturers, uploaded to [employer] tab  Patient Self Care Activities:  [name] Gather necessary documents needed to apply for medication assistance  Please see past updates related to this goal by clicking on the "Past Updates" button in the selected goal          Follow up plan: Telephone follow up appointment with care management team member scheduled for: 1 week with PharmD  [name], PharmD Clinical Pharmacist [place] [phone]

## 2018-09-25 NOTE — Patient Instructions (Signed)
Thank you allowing the Chronic Care Management Team to be a part of your care! It was a pleasure speaking with you today!  1. Please continue to take all medications as prescribed 2. Please refer to previously provided written information about diabetes self care as needed 3. Continue to read food lables for portion size and carbohydrate count. Remember, per the ADA (American Diabetes Association) website, you can have 45-60 grams of carbs at each meal (3 meals a day) and a 1 time snack of 15-30 grams of carbs before bedtime (evening snack). 4. Cardio exercise is your friend. Not only will it reduce your blood sugars, it will reduce your risk of stroke and heart attack. Try to exercise a little each day. Walking is a great choice.  CCM (Chronic Care Management) Team   Trish Fountain RN, BSN Nurse Care Coordinator  [phone]  [name] PharmD  Clinical Pharmacist  [phone]   Elliot Gurney, LCSW Clinical Social Worker [phone]  Goals Addressed            This Visit's Progress   . COMPLETED: per wife "We really need to get his sugars under control because they are in the 200s" (pt-stated)       Current Barriers:  [name] Knowledge Deficits related to basic Diabetes pathophysiology and self care/management . Financial strain (medication cost once in doughnut hole) . Sleep schedule  Nurse Case Manager Clinical Goal(s):  [name] Over the next 14 days, patient will demonstrate improved adherence to prescribed treatment plan for diabetes self care/management as evidenced by checking blood glucose levels as prescribed, adhering to ADA/low carb diet, daily exercise, and medication adherence-goal met 08/28/2018 . Over the next 30 days, patient will continue to demonstrate improved adherence to prescribed treatment plan for diabetes self care management.  Interventions:  . Reviewed medications and confirmed patient was switched from Trulicity to Wise . Discussed blood sugar  readings and praised patient for his efforts to decrease his blood sugars . Assessed for additional questions regarding DM self care . Reviewed scheduled/upcoming provider appointments including: PCP 08/29/2018 . Advised patient/wife to decrease blood sugar checks to daily fasting and post prandial 2 x week to decrease patient frustration with blood sugar checks . Assessed for engagement with CCM Clinic Pharmacist  Patient Self Care Activities:  . Self administers medications as prescribed . Attends all scheduled provider appointments . Checks blood sugars as prescribed and utilize hyper and hypoglycemia protocol as needed . Adhere to ADA diet as discussed  Plan:  . RNCM will provide educational materials including Advanced Directives document via postal mail and email   Initial goal documentation        The patient verbalized understanding of instructions provided today and declined a print copy of patient instruction materials.   The patient has been provided with contact information for the care management team and has been advised to call with any health related questions or concerns.   SYMPTOMS OF A STROKE   You have any symptoms of stroke. "BE FAST" is an easy way to remember the main warning signs: ? B - Balance. Signs are dizziness, sudden trouble walking, or loss of balance. ? E - Eyes. Signs are trouble seeing or a sudden change in how you see. ? F - Face. Signs are sudden weakness or loss of feeling of the face, or the face or eyelid drooping on one side. ? A - Arms. Signs are weakness or loss of feeling in an arm. This  happens suddenly and usually on one side of the body. ? S - Speech. Signs are sudden trouble speaking, slurred speech, or trouble understanding what people say. ? T - Time. Time to call emergency services. Write down what time symptoms started.  You have other signs of stroke, such as: ? A sudden, very bad headache with no known cause. ? Feeling sick to  your stomach (nausea). ? Throwing up (vomiting). ? Jerky movements you cannot control (seizure).  SYMPTOMS OF A HEART ATTACK  What are the signs or symptoms? Symptoms of this condition include:  Chest pain. It may feel like: ? Crushing or squeezing. ? Tightness, pressure, fullness, or heaviness.  Pain in the arm, neck, jaw, back, or upper body.  Shortness of breath.  Heartburn.  Indigestion.  Nausea.  Cold sweats.  Feeling tired.  Sudden lightheadedness.

## 2018-09-25 NOTE — Chronic Care Management (AMB) (Signed)
Chronic Care Management   Follow Up Note   09/25/2018 Name: Alan George. MRN: [id number] DOB: 25-Jan-1956  Referred by: Trinna Post, PA-C Reason for referral : Chronic Care Management (follow up DM)   Subjective: per wife "He is doing a really good job managing his diet now"   Objective:  Lab Results  Component Value Date   HGBA1C 8.0 (A) 08/29/2018   HGBA1C 9.2 (A) 07/10/2018   HGBA1C 10.3 (A) 03/13/2018   Lab Results  Component Value Date   MICROALBUR 6.7 12/19/2016   LDLCALC 134 (H) 12/05/2017   CREATININE 0.91 12/05/2017    Assessment:  Alan Georgeis a 63year old [place], PA-Cfor primary care. Alan George the CCM team to consult the patient forcare management secondary to medication affordabilityand DM management. Telephone outreach today to patient/patient wife to assess for progression towards health goals.  Review of patient status, including review of consultants reports, relevant laboratory and other test results, and collaboration with appropriate care team members and the patient's provider was performed as part of comprehensive patient evaluation and provision of chronic care management services.    Goals Addressed            This Visit's Progress   . COMPLETED: per wife "We really need to get his sugars under control because they are in the 200s" (pt-stated)       Per wife Alan George, patient continues to do very well managing his diabetes. She has removed all the high carb snack food from the pantry. He is snacking on beef sticks, a few rice cakes, some low fat cheese, and an occasional boiled egg. This is a big improvement in Alan George's diet as he was consuming lots of simple sugar snacks. He continues to check his fasting sugars several times a week and reports 125 as average.   Current Barriers:  [name] Knowledge Deficits related to basic Diabetes pathophysiology and self care/management . Financial strain (medication  cost once in doughnut hole) . Sleep schedule  Nurse Case Manager Clinical Goal(s):  [name] Over the next 14 days, patient will demonstrate improved adherence to prescribed treatment plan for diabetes self care/management as evidenced by checking blood glucose levels as prescribed, adhering to ADA/low carb diet, daily exercise, and medication adherence-goal met 08/28/2018 . Over the next 30 days, patient will continue to demonstrate improved adherence to prescribed treatment plan for diabetes self care management.  Interventions:  . Reviewed medications and confirmed patient was switched from Trulicity to [place] . Discussed blood sugar readings and praised patient for his efforts to decrease his blood sugars . Assessed for additional questions regarding DM self care . Reviewed scheduled/upcoming provider appointments including: PCP 08/29/2018 . Advised patient/wife to decrease blood sugar checks to daily fasting and post prandial 2 x week to decrease patient frustration with blood sugar checks . Assessed for engagement with CCM Clinic Pharmacist  Patient Self Care Activities:  . Self administers medications as prescribed . Attends all scheduled provider appointments . Checks blood sugars as prescribed and utilize hyper and hypoglycemia protocol as needed . Adhere to ADA diet as discussed  Plan:  . RNCM will provide educational materials including Advanced Directives document via postal mail and email   Initial goal documentation         The patient has been provided with contact information for the care management team and has been advised to call with any health related questions or concerns.    Bryttney Netzer E. Rollene Rotunda, RN,  [facility] Care Management [phone]

## 2018-09-27 ENCOUNTER — Other Ambulatory Visit: Payer: Self-pay | Admitting: Psychiatry

## 2018-09-27 DIAGNOSIS — F429 Obsessive-compulsive disorder, unspecified: Secondary | ICD-10-CM

## 2018-09-27 DIAGNOSIS — G4700 Insomnia, unspecified: Secondary | ICD-10-CM

## 2018-09-27 DIAGNOSIS — F329 Major depressive disorder, single episode, unspecified: Secondary | ICD-10-CM

## 2018-09-30 ENCOUNTER — Telehealth: Payer: Self-pay

## 2018-10-09 ENCOUNTER — Ambulatory Visit: Payer: Self-pay | Admitting: Pharmacist

## 2018-10-09 DIAGNOSIS — E1129 Type 2 diabetes mellitus with other diabetic kidney complication: Secondary | ICD-10-CM

## 2018-10-09 DIAGNOSIS — E1169 Type 2 diabetes mellitus with other specified complication: Secondary | ICD-10-CM

## 2018-10-09 DIAGNOSIS — E782 Mixed hyperlipidemia: Secondary | ICD-10-CM

## 2018-10-13 NOTE — Chronic Care Management (AMB) (Signed)
  Chronic Care Management   Note  10/13/2018- Late entry Name: Kingdom Vanzanten. MRN: [id number] DOB: [birth year]  Successful outreach to patient's spouse, [name] (DPR on file, HIPAA identifiers verified.) to let her know that patient's insulin is at the practice.   Medication Assistance: Patient is has been approved for Lantus made by [employer] and prescribed by Royetta Crochet, PA. Approved at unknown date in August 2020 and expires 02/19/19   Goals Addressed            This Visit's Progress   . "We are getting close to the donut hole" (pt-stated)       Current Barriers:  . financial  Pharmacist Clinical Goal(s): Over the next 14 days, Mr.. Mackins will provide the necessary supplementary documents (proof of out of pocket prescription expenditure, proof of household income) needed for medication assistance applications to CCM pharmacist.   Interventions: . CCM pharmacist will apply for medication assistance program for BREO made by [place], Lantus made by [employer]ofi, and pending insurance approval and prescription by [name] made by [employer]  Updated 7/23: [name] has received and completed applications, plans to return them 09/15/18  Updated 8/6: Submitted applications to manufacturers, uploaded to media tab  Updated 8/20: patient approved for Lantus made by [employer]; [name] states she received letter from [employer]  Patient Self Care Activities:  [name] Gather necessary documents needed to apply for medication assistance  Please see past updates related to this goal by clicking on the "Past Updates" button in the selected goal          Follow up plan: Telephone follow up appointment with care management team member scheduled for: 2 weeks  [name], PharmD Clinical Pharmacist [place] [phone]

## 2018-10-19 IMAGING — CR DG CHEST 2V
1 series · 2 of 2 positions shown · non-contrast
Comparison: None.

CLINICAL DATA: Cough for 3 weeks

EXAM:
CHEST - 2 VIEW

[Series 1: dg chest 2 view · 0.14mm/px · 2 of 2 slices shown]
[im 1/2]
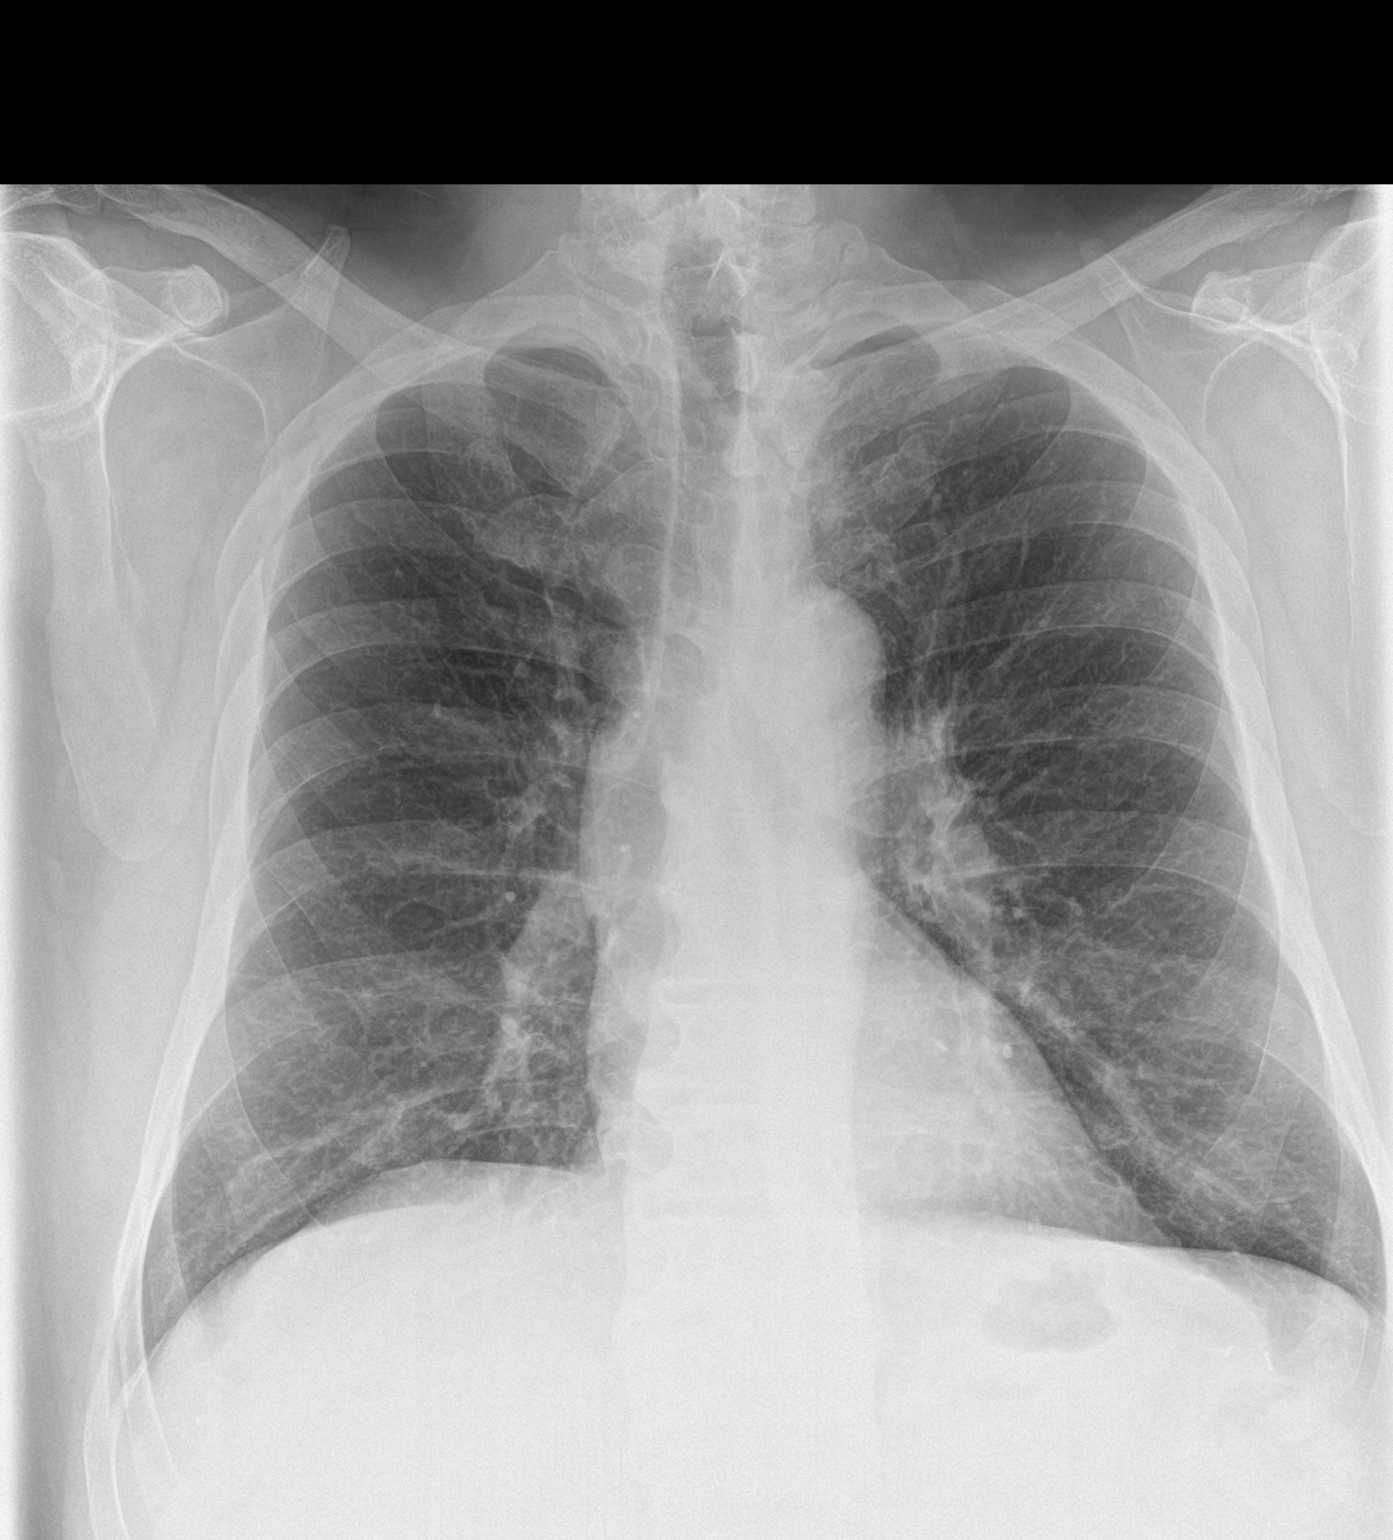
[im 2/2]
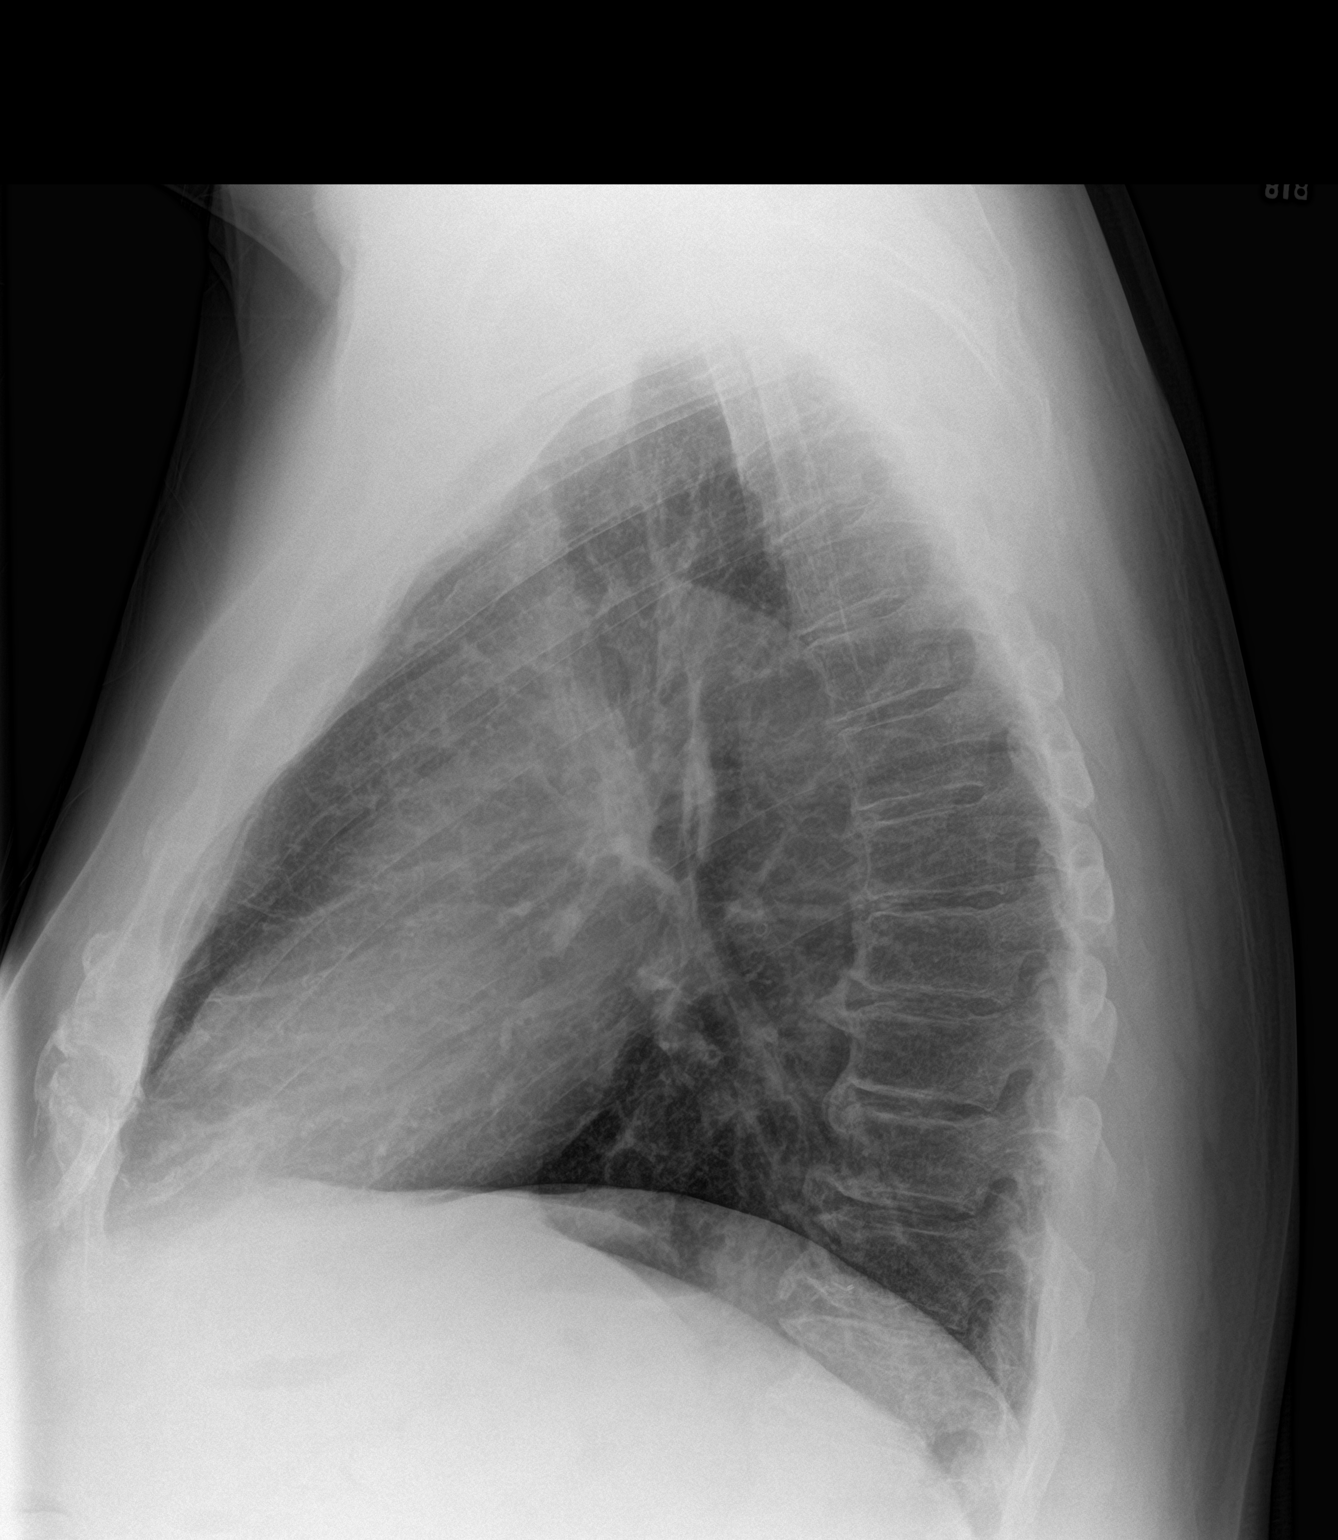

[2 of 2 positions shown; findings below may reference images not displayed]

FINDINGS: Lungs are clear. Heart size and pulmonary vascularity are normal. No
adenopathy. There is degenerative change in the lower thoracic
region.
IMPRESSION: No edema or consolidation.

## 2018-10-23 ENCOUNTER — Telehealth: Payer: Self-pay

## 2018-10-23 ENCOUNTER — Ambulatory Visit: Payer: Self-pay | Admitting: Pharmacist

## 2018-10-23 DIAGNOSIS — J45909 Unspecified asthma, uncomplicated: Secondary | ICD-10-CM

## 2018-10-23 DIAGNOSIS — E1129 Type 2 diabetes mellitus with other diabetic kidney complication: Secondary | ICD-10-CM

## 2018-10-23 DIAGNOSIS — E785 Hyperlipidemia, unspecified: Secondary | ICD-10-CM

## 2018-10-23 DIAGNOSIS — E1169 Type 2 diabetes mellitus with other specified complication: Secondary | ICD-10-CM

## 2018-10-23 NOTE — Chronic Care Management (AMB) (Signed)
  Chronic Care Management   Note  10/23/2018 Name: Alan George. MRN: [id number] DOB: 08/09/1955  63 y.o. year old male referred to Chronic Care Management by Trinna Post, PA-C . CCM pharmacist assisting with medication assistance and medication management.   Was unable to reach patient via telephone today and have left HIPAA compliant voicemail asking patient to return my call. (unsuccessful outreach #1).  Follow up plan: A HIPPA compliant phone message was left for the patient providing contact information and requesting a return call.  The care management team will reach out to the patient again over the next 5-7 days.   [name], PharmD Clinical Pharmacist [place] [phone]

## 2018-10-24 DIAGNOSIS — Z79891 Long term (current) use of opiate analgesic: Secondary | ICD-10-CM | POA: Diagnosis not present

## 2018-10-24 DIAGNOSIS — M25561 Pain in right knee: Secondary | ICD-10-CM | POA: Diagnosis not present

## 2018-10-24 DIAGNOSIS — M47819 Spondylosis without myelopathy or radiculopathy, site unspecified: Secondary | ICD-10-CM | POA: Diagnosis not present

## 2018-10-24 DIAGNOSIS — G8929 Other chronic pain: Secondary | ICD-10-CM | POA: Diagnosis not present

## 2018-10-24 DIAGNOSIS — M25552 Pain in left hip: Secondary | ICD-10-CM | POA: Diagnosis not present

## 2018-10-24 DIAGNOSIS — Z5181 Encounter for therapeutic drug level monitoring: Secondary | ICD-10-CM | POA: Diagnosis not present

## 2018-10-24 DIAGNOSIS — M5442 Lumbago with sciatica, left side: Secondary | ICD-10-CM | POA: Diagnosis not present

## 2018-10-24 DIAGNOSIS — M1610 Unilateral primary osteoarthritis, unspecified hip: Secondary | ICD-10-CM | POA: Diagnosis not present

## 2018-10-24 DIAGNOSIS — M25562 Pain in left knee: Secondary | ICD-10-CM | POA: Diagnosis not present

## 2018-10-24 DIAGNOSIS — M47818 Spondylosis without myelopathy or radiculopathy, sacral and sacrococcygeal region: Secondary | ICD-10-CM | POA: Diagnosis not present

## 2018-10-27 ENCOUNTER — Other Ambulatory Visit: Payer: Self-pay | Admitting: Physician Assistant

## 2018-10-27 DIAGNOSIS — E1129 Type 2 diabetes mellitus with other diabetic kidney complication: Secondary | ICD-10-CM

## 2018-10-27 DIAGNOSIS — I10 Essential (primary) hypertension: Secondary | ICD-10-CM

## 2018-10-28 ENCOUNTER — Telehealth: Payer: Self-pay

## 2018-10-28 NOTE — Telephone Encounter (Signed)
L.O.V. was 08/29/2018. 

## 2018-10-29 ENCOUNTER — Telehealth: Payer: Self-pay

## 2018-10-29 ENCOUNTER — Ambulatory Visit: Payer: Self-pay | Admitting: Pharmacist

## 2018-10-29 DIAGNOSIS — I10 Essential (primary) hypertension: Secondary | ICD-10-CM

## 2018-10-29 DIAGNOSIS — Z794 Long term (current) use of insulin: Secondary | ICD-10-CM

## 2018-10-29 DIAGNOSIS — E1129 Type 2 diabetes mellitus with other diabetic kidney complication: Secondary | ICD-10-CM

## 2018-10-29 DIAGNOSIS — E1169 Type 2 diabetes mellitus with other specified complication: Secondary | ICD-10-CM

## 2018-10-29 DIAGNOSIS — E785 Hyperlipidemia, unspecified: Secondary | ICD-10-CM

## 2018-10-29 NOTE — Patient Instructions (Signed)
Goals Addressed            This Visit's Progress   . "We are getting close to the donut hole" (pt-stated)       Current Barriers:  . financial  Pharmacist Clinical Goal(s): Over the next 14 days, Alan George will provide the necessary supplementary documents (proof of out of pocket prescription expenditure, proof of household income) needed for medication assistance applications to CCM pharmacist.   Interventions: . CCM pharmacist will apply for medication assistance program for BREO made by [place], Lantus made by [employer]ofi, and pending insurance approval and prescription by [name] made by [employer]  Updated 7/23: Alan George has received and completed applications, plans to return them 09/15/18  Updated 8/6: Submitted applications to manufacturers, uploaded to media tab  Updated 8/20: patient approved for Lantus made by [employer]ofi; Alan George states she received letter from [employer]  Updated 9/9: vm from Alan George stating that they received letter from [place] application needed further documentation regarding prescription drug insurance  Patient Self Care Activities:  [name] Gather necessary documents needed to apply for medication assistance  Please see past updates related to this goal by clicking on the "Past Updates" button in the selected goal

## 2018-10-29 NOTE — Chronic Care Management (AMB) (Signed)
Chronic Care Management   Follow Up Note   10/29/2018 Name: Alan George. MRN: [id number] DOB: [birth year]  Subjective  Alan George. is a 63 y.o. year old male who is a primary care patient of Trinna Post, [place]. The CCM pharmacist was following up today regarding progression of care goals.     Was unable to reach patient's wife Alan George (DPR on file) via telephone today and have left HIPAA compliant voicemail asking patient to return my call. (unsuccessful outreach #1).    Outpatient Encounter Medications as of 10/29/2018  Medication Sig Note  . albuterol (PROAIR HFA) 108 (90 Base) MCG/ACT inhaler Inhale 2 puffs into the lungs every 4 (four) hours as needed for wheezing or shortness of breath.   [name] atorvastatin (LIPITOR) 80 MG tablet TAKE 1 TABLET(80 MG) BY MOUTH DAILY   . cyclobenzaprine (FLEXERIL) 10 MG tablet Take by mouth. 10/19/2014: Received from: [place] [facility]  . glucose blood (CONTOUR NEXT TEST) test strip Check blood sugar once daily   . HYDROmorphone (DILAUDID) 4 MG tablet take 0.5 tablet by mouth every 4 hours if needed 10/19/2014: Received from: External Pharmacy  . Insulin Glargine (LANTUS SOLOSTAR) 100 UNIT/ML Solostar Pen Inject 30 Units into the skin daily.   . Insulin Pen Needle (NOVOFINE) 32G X 6 MM MISC Use 1 needle per once weekly ozempic injection.   [name] losartan (COZAAR) 50 MG tablet TAKE 1 TABLET(50 MG) BY MOUTH DAILY   . Melatonin 1 MG TABS Take by mouth. 10/19/2014: Received from: [place]  . metFORMIN (GLUCOPHAGE) 1000 MG tablet TAKE 1 TABLET(1000 MG) BY MOUTH TWICE DAILY WITH A MEAL   . mirtazapine (REMERON) 30 MG tablet Take 1 tablet (30 mg total) by mouth at bedtime.   [name] NARCAN 4 MG/0.1ML LIQD nasal spray kit  08/06/2018: Available if needed  . omeprazole (PRILOSEC) 20 MG capsule TAKE 1 CAPSULE(20 MG) BY MOUTH DAILY   . Semaglutide,0.25 or 0.5MG/DOS, (OZEMPIC, 0.25 OR 0.5 MG/DOSE,) 2 MG/1.5ML SOPN Inject 0.5 mg  into the skin once a week.   . sertraline (ZOLOFT) 100 MG tablet Take 2 tablets (200 mg total) by mouth daily.    No facility-administered encounter medications on file as of 10/29/2018.      Goals Addressed            This Visit's Progress   . "We are getting close to the donut hole" (pt-stated)       Current Barriers:  . financial  Pharmacist Clinical Goal(s): Over the next 14 days, Mr.. Miklas will provide the necessary supplementary documents (proof of out of pocket prescription expenditure, proof of household income) needed for medication assistance applications to CCM pharmacist.   Interventions: . CCM pharmacist will apply for medication assistance program for BREO made by [place], Lantus made by Sanofi, and pending insurance approval and prescription by [name] made by [employer]  Updated 7/23: [name] has received and completed applications, plans to return them 09/15/18  Updated 8/6: Submitted applications to manufacturers, uploaded to media tab  Updated 8/20: patient approved for Lantus made by Sanofi; Alan George states she received letter from [employer]  Updated 9/9: vm from Alan George stating that they received letter from [place] application needed further documentation regarding prescription drug insurance  Patient Self Care Activities:  [name] Gather necessary documents needed to apply for medication assistance  Please see past updates related to this goal by clicking on the "Past Updates"  button in the selected goal          A HIPPA compliant phone message was left for the patient providing contact information and requesting a return call.  Follow up call within 5-7 days   [name], PharmD Clinical Pharmacist [place] [phone]

## 2018-11-03 ENCOUNTER — Telehealth: Payer: Self-pay

## 2018-11-03 ENCOUNTER — Encounter: Payer: Self-pay | Admitting: Pharmacist

## 2018-11-03 NOTE — Progress Notes (Signed)
This encounter was created in error - please disregard.

## 2018-11-06 ENCOUNTER — Telehealth: Payer: Self-pay

## 2018-11-06 ENCOUNTER — Ambulatory Visit: Payer: Self-pay | Admitting: Pharmacist

## 2018-11-06 NOTE — Chronic Care Management (AMB) (Signed)
  Chronic Care Management   Unsuccessful Call   11/06/2018 Name: Alan George. MRN: [id number] DOB: 1955-11-24  Subjective  Alan George. is a 63 y.o. year old male who is a primary care patient of Trinna Post, [place]. The CCM pharmacist was following up today regarding progression of care goals.     Was unable to reach patient's wife Alan George (DPR on file) via telephone today and have left HIPAA compliant voicemail asking patient to return my call. (unsuccessful outreach #2).   A HIPPA compliant phone message was left for the patient providing contact information and requesting a return call.  Follow up call within 5-7 days   [name], PharmD Clinical Pharmacist [place] [phone]

## 2018-11-20 ENCOUNTER — Telehealth: Payer: Self-pay

## 2018-11-21 ENCOUNTER — Ambulatory Visit: Payer: Self-pay | Admitting: Pharmacist

## 2018-11-21 DIAGNOSIS — Z794 Long term (current) use of insulin: Secondary | ICD-10-CM

## 2018-11-21 DIAGNOSIS — E1129 Type 2 diabetes mellitus with other diabetic kidney complication: Secondary | ICD-10-CM

## 2018-11-21 NOTE — Chronic Care Management (AMB) (Signed)
  Chronic Care Management   Care Coordination Note  11/21/2018 Name: Shonta Phillis. MRN: [id number] DOB: 12-06-1955  Care coordination: patient supplied letter from [facility] as well as additional documentation for Ozempic application. Updated application.   Goals Addressed            This Visit's Progress   . "We are getting close to the donut hole" (pt-stated)       Current Barriers:  . financial  Pharmacist Clinical Goal(s): Over the next 14 days, Mr.. Haque will provide the necessary supplementary documents (proof of out of pocket prescription expenditure, proof of household income) needed for medication assistance applications to CCM pharmacist.   Interventions: . CCM pharmacist will apply for medication assistance program for BREO made by [place], Lantus made by Sanofi, and pending insurance approval and prescription by [name] made by [employer]  Updated 7/23: [name] has received and completed applications, plans to return them 09/15/18  Updated 8/6: Submitted applications to manufacturers, uploaded to media tab  Updated 8/20: patient approved for Lantus made by [employer]ofi; [name] states she received letter from [employer]  Updated 9/9: vm from [name] stating that they received letter from [place] application needed further documentation regarding prescription drug insurance  Updated 10/2: received letter from patient as well as additional documentation; updated application  Patient Self Care Activities:  [name] Gather necessary documents needed to apply for medication assistance  Please see past updates related to this goal by clicking on the "Past Updates" button in the selected goal          Follow up plan: Telephone follow up appointment with care management team member scheduled for: 5-7 days  [name], PharmD Clinical Pharmacist [place] [phone]

## 2018-11-25 ENCOUNTER — Other Ambulatory Visit: Payer: Self-pay | Admitting: Physician Assistant

## 2018-11-25 DIAGNOSIS — E785 Hyperlipidemia, unspecified: Secondary | ICD-10-CM

## 2018-11-25 MED ORDER — ATORVASTATIN CALCIUM 80 MG PO TABS: ORAL_TABLET | ORAL | 2 refills | Status: DC

## 2018-11-25 NOTE — Telephone Encounter (Signed)
[facility] faxed refill request for the following medications:   atorvastatin (LIPITOR) 80 MG tablet   Please advise.  

## 2018-12-02 ENCOUNTER — Ambulatory Visit: Payer: Self-pay | Admitting: Physician Assistant

## 2018-12-04 ENCOUNTER — Other Ambulatory Visit: Payer: Self-pay | Admitting: Physician Assistant

## 2018-12-04 ENCOUNTER — Ambulatory Visit: Payer: Self-pay | Admitting: Pharmacist

## 2018-12-04 DIAGNOSIS — E1129 Type 2 diabetes mellitus with other diabetic kidney complication: Secondary | ICD-10-CM

## 2018-12-04 DIAGNOSIS — K219 Gastro-esophageal reflux disease without esophagitis: Secondary | ICD-10-CM

## 2018-12-04 DIAGNOSIS — R809 Proteinuria, unspecified: Secondary | ICD-10-CM

## 2018-12-04 MED ORDER — OMEPRAZOLE 20 MG PO CPDR: DELAYED_RELEASE_CAPSULE | ORAL | 1 refills | Status: DC

## 2018-12-04 NOTE — Chronic Care Management (AMB) (Signed)
  Chronic Care Management   Care Coordination Note  12/04/2018 Name: Alan George. MRN: [id number] DOB: March 11, 1955  Care Coordination: followed up with [employer] regarding status of Ozempic application;   Medication Assistance: Patient is has been approved for Ozempic made by [employer] and prescribed by Carles Collet, PA-C. Expires 02/19/2019. Patient should receive medications via mail in 5-7 business days.    Goals Addressed            This Visit's Progress   . "We are getting close to the donut hole" (pt-stated)       Current Barriers:  . financial  Pharmacist Clinical Goal(s): Over the next 14 days, Alan George will provide the necessary supplementary documents (proof of out of pocket prescription expenditure, proof of household income) needed for medication assistance applications to CCM pharmacist.   Interventions: . CCM pharmacist will apply for medication assistance program for BREO made by [place], Lantus made by Sanofi, and pending insurance approval and prescription by [name] made by [employer]  Updated 7/23: Alan George has received and completed applications, plans to return them 09/15/18  Updated 8/6: Submitted applications to manufacturers, uploaded to media tab  Updated 8/20: patient approved for Lantus made by [employer]ofi; Alan George states she received letter from [employer]  Updated 9/9: vm from Alan George stating that they received letter from [employer] stating Alan George application needed further documentation regarding prescription drug insurance  Updated 10/2: received letter from patient as well as additional documentation; updated application  Updated 97/67: followed up with [employer] regarding Ozempic application; left vm for Alan George- patient is approved for Ozempic until 02/19/2019  Patient Self Care Activities:  [name] Gather necessary documents needed to apply for medication assistance  Please see past updates related to this goal  by clicking on the "Past Updates" button in the selected goal          Follow up plan: Telephone follow up appointment with care management team member scheduled for: Monday morning (Alan George works third shift) to review approval and refill process;   Alan George, PharmD Clinical Pharmacist Alan George [phone]

## 2018-12-04 NOTE — Telephone Encounter (Signed)
[facility] faxed refill request for the following medications:  omeprazole (PRILOSEC) 20 MG capsule     Please advise.  

## 2018-12-05 ENCOUNTER — Other Ambulatory Visit: Payer: Self-pay | Admitting: Physician Assistant

## 2018-12-05 DIAGNOSIS — E1129 Type 2 diabetes mellitus with other diabetic kidney complication: Secondary | ICD-10-CM

## 2018-12-05 DIAGNOSIS — R809 Proteinuria, unspecified: Secondary | ICD-10-CM

## 2018-12-05 MED ORDER — METFORMIN HCL 1000 MG PO TABS: ORAL_TABLET | ORAL | 0 refills | Status: DC

## 2018-12-05 NOTE — Telephone Encounter (Signed)
[employer] faxed refill request for the following medications:  metFORMIN (GLUCOPHAGE) 1000 MG tablet  [place] patient  Please advise.

## 2018-12-08 ENCOUNTER — Ambulatory Visit: Payer: Medicare Other | Admitting: Pharmacist

## 2018-12-08 DIAGNOSIS — Z794 Long term (current) use of insulin: Secondary | ICD-10-CM

## 2018-12-08 DIAGNOSIS — E1129 Type 2 diabetes mellitus with other diabetic kidney complication: Secondary | ICD-10-CM

## 2018-12-09 NOTE — Chronic Care Management (AMB) (Signed)
  Chronic Care Management   Care Coordination Note  12/09/2018 Name: Kolby Schara. MRN: [id number] DOB: January 04, 1956  Care coordination- left message with [name] (DPR on file) regarding Ozempic approval and refills process, as well as Medicare open enrollment information.   Goals Addressed            This Visit's Progress   . COMPLETED: "We are getting close to the donut hole" (pt-stated)       Current Barriers:  . financial  Pharmacist Clinical Goal(s): Over the next 14 days, Mr.. Mau will provide the necessary supplementary documents (proof of out of pocket prescription expenditure, proof of household income) needed for medication assistance applications to CCM pharmacist.   Interventions: . CCM pharmacist will apply for medication assistance program for BREO made by [place], Lantus made by Sanofi, and pending insurance approval and prescription by [name] made by [employer]  Updated 7/23: [name] has received and completed applications, plans to return them 09/15/18  Updated 8/6: Submitted applications to manufacturers, uploaded to media tab  Updated 8/20: patient approved for Lantus made by Sanofi; [name] states she received letter from [employer]  Updated 9/9: vm from [name] stating that they received letter from [employer] stating [place] application needed further documentation regarding prescription drug insurance  Updated 10/2: received letter from patient as well as additional documentation; updated application  Updated 63/01: followed up with [employer] regarding Ozempic application; left vm for [name]- patient is approved for Ozempic until 02/19/2019  Patient Self Care Activities:  [name] Gather necessary documents needed to apply for medication assistance  Please see past updates related to this goal by clicking on the "Past Updates" button in the selected goal      . How do I know what Medicare plan to pick in October  (pt-stated)       Current Barriers:  . Overwhelmed by information and advertisments during open enrollment  Pharmacist Clinical Goal(s):  [name] During open enrollment 2020, patient and wife will utilize [facility]) to determine best insurance plan for Mr. Guerrier.   Interventions: . Pharmacist provided information for [employer] and [place] Information Program ([address] [place]  [address] o [phone] or toll free [phone] Updated 10/20: open enrollment, provided the Caters with this information to chose the best Medicare plan  Patient Self Care Activities:  . Contact SHIIP during open enrollment . Utilize [facility] website for more information . Volunteer- not selling anything!  Please see past updates related to this goal by clicking on the "Past Updates" button in the selected goal          Follow up plan: Telephone follow up appointment with care management team member scheduled for: 1 month with PharmD  [name], PharmD Clinical Pharmacist [place] [phone]

## 2018-12-09 NOTE — Patient Instructions (Signed)
Goals Addressed            This Visit's Progress   . COMPLETED: "We are getting close to the donut hole" (pt-stated)       Current Barriers:  . financial  Pharmacist Clinical Goal(s): Over the next 14 days, Mr.. Broughton will provide the necessary supplementary documents (proof of out of pocket prescription expenditure, proof of household income) needed for medication assistance applications to CCM pharmacist.   Interventions: . CCM pharmacist will apply for medication assistance program for BREO made by [place], Lantus made by Sanofi, and pending insurance approval and prescription by [name] made by [employer]  Updated 7/23: [name] has received and completed applications, plans to return them 09/15/18  Updated 8/6: Submitted applications to manufacturers, uploaded to media tab  Updated 8/20: patient approved for Lantus made by [employer]ofi; [name] states she received letter from [employer]  Updated 9/9: vm from [name] stating that they received letter from [employer] stating [place] application needed further documentation regarding prescription drug insurance  Updated 10/2: received letter from patient as well as additional documentation; updated application  Updated 77/82: followed up with [employer] regarding Ozempic application; left vm for [name]- patient is approved for Ozempic until 02/19/2019  Patient Self Care Activities:  [name] Gather necessary documents needed to apply for medication assistance  Please see past updates related to this goal by clicking on the "Past Updates" button in the selected goal      . How do I know what Medicare plan to pick in October (pt-stated)       Current Barriers:  . Overwhelmed by information and advertisments during open enrollment  Pharmacist Clinical Goal(s):  [name] During open enrollment 2020, patient and wife will utilize [facility]) to determine best insurance plan for Mr.  Nading.   Interventions: . Pharmacist provided information for [employer] and [place] Information Program ([address] [place]  [address] o [phone] or toll free [phone] Updated 10/20: open enrollment, provided the Caters with this information to chose the best Medicare plan  Patient Self Care Activities:  . Contact [facility]IIP during open enrollment . Utilize [facility] website for more information . Volunteer- not selling anything!  Please see past updates related to this goal by clicking on the "Past Updates" button in the selected goal

## 2018-12-11 ENCOUNTER — Ambulatory Visit: Payer: Self-pay | Admitting: Pharmacist

## 2018-12-11 DIAGNOSIS — R809 Proteinuria, unspecified: Secondary | ICD-10-CM

## 2018-12-11 DIAGNOSIS — Z794 Long term (current) use of insulin: Secondary | ICD-10-CM

## 2018-12-11 DIAGNOSIS — E785 Hyperlipidemia, unspecified: Secondary | ICD-10-CM

## 2018-12-11 DIAGNOSIS — I10 Essential (primary) hypertension: Secondary | ICD-10-CM

## 2018-12-11 DIAGNOSIS — E1129 Type 2 diabetes mellitus with other diabetic kidney complication: Secondary | ICD-10-CM

## 2018-12-11 NOTE — Patient Instructions (Signed)
[place] (SHIIP)  [address] [place]  [address] [phone] or toll free [phone]      Alan George is an organization with the state of [place] that provides seniors and those enrolling in [place] the opportunity to speak with trained volunteers to compare and select the best Medicare program. The volunteers are NOT sales people, so I feel comfortable recommending them to my patients. I recommend meeting with a volunteer and having a COMPLETE medication list to truly compare the different costs that might come up through out the year. Please let me know if you have any questions!   Alan George, PharmD Clinical Pharmacist [place] [phone]

## 2018-12-11 NOTE — Chronic Care Management (AMB) (Signed)
  Chronic Care Management   Care Coordination Note  12/11/2018 Name: Alan George. MRN: [id number] DOB: 03/28/1955  Care Coordination: Mrs. Alan George left vm for CCM pharmacist yesterday requesting [facility] information. Additionally, shipments of Ozempic and insulin have arrived at practice for pick up.   Left voicemail for Alan George (On DPR) with information regarding medication pick up and SHIIP,. Will also print out [facility] contact info so she has a copy when picking up medications.    Goals Addressed            This Visit's Progress   . COMPLETED: How do I know what Medicare plan to pick in October (pt-stated)       Current Barriers:  . Overwhelmed by information and advertisments during open enrollment  Pharmacist Clinical Goal(s):  [name] During open enrollment 2020, patient and wife will utilize [facility]) to determine best insurance plan for Mr. Alan George.   Interventions: . Pharmacist provided information for [employer] and [place] Information Program ([address] [place]  [address] o [phone] or toll free [phone] Updated 10/20: open enrollment, provided the Caters with this information to chose the best Medicare plan  Patient Self Care Activities:  . Contact SHIIP during open enrollment . Utilize [facility] website for more information . Volunteer- not selling anything!  Please see past updates related to this goal by clicking on the "Past Updates" button in the selected goal           Follow up plan: A HIPPA compliant phone message was left for the patient providing contact information and requesting a return call.   [name], PharmD Clinical Pharmacist [place] [phone]

## 2018-12-19 ENCOUNTER — Encounter: Payer: Self-pay | Admitting: Physician Assistant

## 2018-12-19 ENCOUNTER — Ambulatory Visit (INDEPENDENT_AMBULATORY_CARE_PROVIDER_SITE_OTHER): Payer: Medicare Other | Admitting: Physician Assistant

## 2018-12-19 ENCOUNTER — Other Ambulatory Visit: Payer: Self-pay

## 2018-12-19 ENCOUNTER — Other Ambulatory Visit: Payer: Self-pay | Admitting: Physician Assistant

## 2018-12-19 VITALS — BP 130/78 | HR 112 | Temp 96.8°F | Wt 234.8 lb

## 2018-12-19 DIAGNOSIS — I10 Essential (primary) hypertension: Secondary | ICD-10-CM | POA: Diagnosis not present

## 2018-12-19 DIAGNOSIS — E1169 Type 2 diabetes mellitus with other specified complication: Secondary | ICD-10-CM

## 2018-12-19 DIAGNOSIS — R809 Proteinuria, unspecified: Secondary | ICD-10-CM | POA: Diagnosis not present

## 2018-12-19 DIAGNOSIS — Z794 Long term (current) use of insulin: Secondary | ICD-10-CM

## 2018-12-19 DIAGNOSIS — E785 Hyperlipidemia, unspecified: Secondary | ICD-10-CM

## 2018-12-19 DIAGNOSIS — E1129 Type 2 diabetes mellitus with other diabetic kidney complication: Secondary | ICD-10-CM

## 2018-12-19 LAB — POCT GLYCOSYLATED HEMOGLOBIN (HGB A1C)

## 2018-12-19 NOTE — Patient Instructions (Signed)
Diabetes Mellitus and Exercise Exercising regularly is important for your overall health, especially when you have diabetes (diabetes mellitus). Exercising is not only about losing weight. It has many other health benefits, such as increasing muscle strength and bone density and reducing body fat and stress. This leads to improved fitness, flexibility, and endurance, all of which result in better overall health. Exercise has additional benefits for people with diabetes, including:  Reducing appetite.  Helping to lower and control blood glucose.  Lowering blood pressure.  Helping to control amounts of fatty substances (lipids) in the blood, such as cholesterol and triglycerides.  Helping the body to respond better to insulin (improving insulin sensitivity).  Reducing how much insulin the body needs.  Decreasing the risk for heart disease by: ? Lowering cholesterol and triglyceride levels. ? Increasing the levels of good cholesterol. ? Lowering blood glucose levels. What is my activity plan? Your health care provider or certified diabetes educator can help you make a plan for the type and frequency of exercise (activity plan) that works for you. Make sure that you:  Do at least 150 minutes of moderate-intensity or vigorous-intensity exercise each week. This could be brisk walking, biking, or water aerobics. ? Do stretching and strength exercises, such as yoga or weightlifting, at least 2 times a week. ? Spread out your activity over at least 3 days of the week.  Get some form of physical activity every day. ? Do not go more than 2 days in a row without some kind of physical activity. ? Avoid being inactive for more than 30 minutes at a time. Take frequent breaks to walk or stretch.  Choose a type of exercise or activity that you enjoy, and set realistic goals.  Start slowly, and gradually increase the intensity of your exercise over time. What do I need to know about managing my  diabetes?   Check your blood glucose before and after exercising. ? If your blood glucose is 240 mg/dL (13.3 mmol/L) or higher before you exercise, check your urine for ketones. If you have ketones in your urine, do not exercise until your blood glucose returns to normal. ? If your blood glucose is 100 mg/dL (5.6 mmol/L) or lower, eat a snack containing 15-20 grams of carbohydrate. Check your blood glucose 15 minutes after the snack to make sure that your level is above 100 mg/dL (5.6 mmol/L) before you start your exercise.  Know the symptoms of low blood glucose (hypoglycemia) and how to treat it. Your risk for hypoglycemia increases during and after exercise. Common symptoms of hypoglycemia can include: ? Hunger. ? Anxiety. ? Sweating and feeling clammy. ? Confusion. ? Dizziness or feeling light-headed. ? Increased heart rate or palpitations. ? Blurry vision. ? Tingling or numbness around the mouth, lips, or tongue. ? Tremors or shakes. ? Irritability.  Keep a rapid-acting carbohydrate snack available before, during, and after exercise to help prevent or treat hypoglycemia.  Avoid injecting insulin into areas of the body that are going to be exercised. For example, avoid injecting insulin into: ? The arms, when playing tennis. ? The legs, when jogging.  Keep records of your exercise habits. Doing this can help you and your health care provider adjust your diabetes management plan as needed. Write down: ? Food that you eat before and after you exercise. ? Blood glucose levels before and after you exercise. ? The type and amount of exercise you have done. ? When your insulin is expected to peak, if you use   insulin. Avoid exercising at times when your insulin is peaking.  When you start a new exercise or activity, work with your health care provider to make sure the activity is safe for you, and to adjust your insulin, medicines, or food intake as needed.  Drink plenty of water while  you exercise to prevent dehydration or heat stroke. Drink enough fluid to keep your urine clear or pale yellow. Summary  Exercising regularly is important for your overall health, especially when you have diabetes (diabetes mellitus).  Exercising has many health benefits, such as increasing muscle strength and bone density and reducing body fat and stress.  Your health care provider or certified diabetes educator can help you make a plan for the type and frequency of exercise (activity plan) that works for you.  When you start a new exercise or activity, work with your health care provider to make sure the activity is safe for you, and to adjust your insulin, medicines, or food intake as needed. This information is not intended to replace advice given to you by your health care provider. Make sure you discuss any questions you have with your health care provider. Document Released: 04/28/2003 Document Revised: 08/30/2016 Document Reviewed: 07/18/2015 Elsevier Patient Education  2020 Elsevier Inc.  

## 2018-12-19 NOTE — Progress Notes (Signed)
Patient: Alan George. Male    DOB: Jul 03, 1955   63 y.o.   MRN: [id number] Visit Date: 12/19/2018  Today's Provider: Trinna Post, PA-C   Chief Complaint  Patient presents with  . Diabetes  . Hypertension  . Hyperlipidemia   Subjective:    HPI  Diabetes Mellitus Type II, Follow-up:   Lab Results  Component Value Date   HGBA1C 8.0 (A) 08/29/2018   HGBA1C 9.2 (A) 07/10/2018   HGBA1C 10.3 (A) 03/13/2018   Last seen for diabetes 3 months ago.  Management since then includes none. He reports good compliance with treatment. He is not having side effects.  Current symptoms include none and have been stable. Home blood sugar records: average 125 Patient reports the highest sugar he has gotten is 166 after meal times.   Episodes of hypoglycemia? no   Current Insulin Regimen: yes Most Recent Eye Exam: Not UTD Last done by [facility] 11/2016. Patient and his wife report that he did not have to pay a few for his last eye exam but he will need new glasses and this is difficult to do with Christmas coming up.  Weight trend: stable Prior visit with dietician: no Current diet: well balanced Current exercise: none  ------------------------------------------------------------------------   Hypertension, follow-up:  BP Readings from Last 3 Encounters:  12/19/18 130/78  08/29/18 138/88  07/10/18 (!) 142/81    He was last seen for hypertension 3 months ago.  BP at that visit was 138/88. Management since that visit includes none.He reports good compliance with treatment. He is not having side effects.  He is not exercising. He is adherent to low salt diet.   Outside blood pressures are around 120's/80's. He is experiencing none.  Patient denies chest pain, chest pressure/discomfort, exertional chest pressure/discomfort, fatigue, lower extremity edema and palpitations.   Cardiovascular risk factors include advanced age (older than 32 for men, 73 for women),  diabetes mellitus, hypertension and male gender.  Use of agents associated with hypertension: none.   ------------------------------------------------------------------------    Lipid/Cholesterol, Follow-up:   Last seen for this 3 months ago.  Management since that visit includes none.  Last Lipid Panel:    Component Value Date/Time   CHOL 214 (H) 12/05/2017 0939   TRIG 143 12/05/2017 0939   HDL 51 12/05/2017 0939   CHOLHDL 4.2 12/05/2017 0939   CHOLHDL 2.9 12/19/2016 0950   VLDL 25 07/03/2016 0938   LDLCALC 134 (H) 12/05/2017 0939   LDLCALC 68 12/19/2016 0950    He reports good compliance with treatment. He is not having side effects.   Wt Readings from Last 3 Encounters:  12/19/18 234 lb 12.8 oz (106.5 kg)  08/29/18 240 lb (108.9 kg)  07/10/18 243 lb 9.6 oz (110.5 kg)    ------------------------------------------------------------------------    Allergies  Allergen Reactions  . Dog Epithelium   . Dust Mite Extract   . Lisinopril Cough  . Pollen Extract   . Tree Extract   . Iodinated Diagnostic Agents Hives    ?gadolinium-thinks it was after MRI  . Other Rash     Current Outpatient Medications:  .  albuterol (PROAIR HFA) 108 (90 Base) MCG/ACT inhaler, Inhale 2 puffs into the lungs every 4 (four) hours as needed for wheezing or shortness of breath., Disp: 1 Inhaler, Rfl: 5 .  atorvastatin (LIPITOR) 80 MG tablet, TAKE 1 TABLET(80 MG) BY MOUTH DAILY, Disp: 90 tablet, Rfl: 2 .  cyclobenzaprine (FLEXERIL) 10 MG tablet,  Take by mouth., Disp: , Rfl:  .  glucose blood (CONTOUR NEXT TEST) test strip, Check blood sugar once daily, Disp: 100 each, Rfl: 12 .  HYDROmorphone (DILAUDID) 4 MG tablet, take 0.5 tablet by mouth every 4 hours if needed, Disp: , Rfl: 0 .  Insulin Glargine (LANTUS SOLOSTAR) 100 UNIT/ML Solostar Pen, Inject 30 Units into the skin daily., Disp: 30 mL, Rfl: 1 .  Insulin Pen Needle (NOVOFINE) 32G X 6 MM MISC, Use 1 needle per once weekly ozempic  injection., Disp: 50 each, Rfl: 0 .  losartan (COZAAR) 50 MG tablet, TAKE 1 TABLET(50 MG) BY MOUTH DAILY, Disp: 90 tablet, Rfl: 1 .  Melatonin 1 MG TABS, Take by mouth., Disp: , Rfl:  .  metFORMIN (GLUCOPHAGE) 1000 MG tablet, TAKE 1 TABLET(1000 MG) BY MOUTH TWICE DAILY WITH A MEAL, Disp: 180 tablet, Rfl: 0 .  NARCAN 4 MG/0.1ML LIQD nasal spray kit, , Disp: , Rfl:  .  omeprazole (PRILOSEC) 20 MG capsule, TAKE 1 CAPSULE(20 MG) BY MOUTH DAILY, Disp: 90 capsule, Rfl: 1 .  Semaglutide,0.25 or 0.5MG/DOS, (OZEMPIC, 0.25 OR 0.5 MG/DOSE,) 2 MG/1.5ML SOPN, Inject 0.5 mg into the skin once a week., Disp: 4 pen, Rfl: 1 .  mirtazapine (REMERON) 30 MG tablet, Take 1 tablet (30 mg total) by mouth at bedtime., Disp: 90 tablet, Rfl: 1 .  sertraline (ZOLOFT) 100 MG tablet, Take 2 tablets (200 mg total) by mouth daily., Disp: 180 tablet, Rfl: 1  Review of Systems  Social History   Tobacco Use  . Smoking status: Former Research scientist (life sciences)  . Smokeless tobacco: Never Used  Substance Use Topics  . Alcohol use: No    Alcohol/week: 0.0 standard drinks      Objective:   BP 130/78 (BP Location: Left Arm, Patient Position: Sitting, Cuff Size: Normal)   Pulse (!) 112   Temp (!) 96.8 F (36 C) (Temporal)   Wt 234 lb 12.8 oz (106.5 kg)   BMI 31.84 kg/m  Vitals:   12/19/18 0825  BP: 130/78  Pulse: (!) 112  Temp: (!) 96.8 F (36 C)  TempSrc: Temporal  Weight: 234 lb 12.8 oz (106.5 kg)  Body mass index is 31.84 kg/m.   Physical Exam Constitutional:      Appearance: Normal appearance.  Cardiovascular:     Rate and Rhythm: Normal rate and regular rhythm.     Heart sounds: Normal heart sounds.  Pulmonary:     Effort: Pulmonary effort is normal.     Breath sounds: Normal breath sounds.  Skin:    General: Skin is warm and dry.  Neurological:     Mental Status: He is alert and oriented to person, place, and time. Mental status is at baseline.  Psychiatric:        Mood and Affect: Mood normal.        Behavior:  Behavior normal.      No results found for any visits on 12/19/18.     Assessment & Plan    1. Type 2 diabetes mellitus with microalbuminuria, with long-term current use of insulin (HCC)  Our A1c machine reads as >14 today which I find hard to believe. Patient states he is taking his medications and they are all paid for. Will check with blood draw to confirm.   Eye exam not done since 2018. Counseled that this is extremely important. Patient and wife report christmas is coming up and this will be difficult for them. Have confirmed they did not pay for exam  last time. Have counseled that he needs to get exam and then can get eye wear when his finances allow. This has been an issue in the past when in 02/2018 his A1c went up to 10.3 after patient was rationing insulin due to buying christmas presents. Have explained that as he will not be charged for the exam reason and his medications are now paid for through drug assistance programs that we have arranged I do not find this to be a valid reason to forego an eye exam and I have placed referral today.   - POCT glycosylated hemoglobin (Hb A1C) - Ambulatory referral to Ophthalmology - Comprehensive Metabolic Panel (CMET) - HgB A1c  2. Essential hypertension  Continue losartan 50 mg.  3. Hyperlipidemia associated with type 2 diabetes mellitus ([place])  Continue statin.   - Lipid Profile  The entirety of the information documented in the History of Present Illness, Review of Systems and Physical Exam were personally obtained by me. Portions of this information were initially documented by [facility] [place], CMA and reviewed by me for thoroughness and accuracy.   F/u 3 months or sooner if medications are adjusted.        Trinna Post, PA-C  [facility]

## 2018-12-20 LAB — COMPREHENSIVE METABOLIC PANEL
ALT: 23 IU/L (ref 0–44)
AST: 23 IU/L (ref 0–40)
Albumin/Globulin Ratio: 1.5 (ref 1.2–2.2)
Albumin: 4.7 g/dL (ref 3.8–4.8)
Alkaline Phosphatase: 69 IU/L (ref 39–117)
BUN/Creatinine Ratio: 17 (ref 10–24)
BUN: 19 mg/dL (ref 8–27)
Bilirubin Total: 0.4 mg/dL (ref 0.0–1.2)
CO2: 23 mmol/L (ref 20–29)
Calcium: 10.3 mg/dL — ABNORMAL HIGH (ref 8.6–10.2)
Chloride: 95 mmol/L — ABNORMAL LOW (ref 96–106)
Creatinine, Ser: 1.1 mg/dL (ref 0.76–1.27)
GFR calc Af Amer: 82 mL/min/{1.73_m2} (ref >59)
GFR calc non Af Amer: 71 mL/min/{1.73_m2} (ref >59)
Globulin, Total: 3.1 g/dL (ref 1.5–4.5)
Glucose: 112 mg/dL — ABNORMAL HIGH (ref 65–99)
Potassium: 4.7 mmol/L (ref 3.5–5.2)
Sodium: 135 mmol/L (ref 134–144)
Total Protein: 7.8 g/dL (ref 6.0–8.5)

## 2018-12-20 LAB — HEMOGLOBIN A1C
Est. average glucose Bld gHb Est-mCnc: 148 mg/dL
Hgb A1c MFr Bld: 6.8 % — ABNORMAL HIGH (ref 4.8–5.6)

## 2018-12-20 LAB — LIPID PANEL
Chol/HDL Ratio: 2.8 ratio (ref 0.0–5.0)
Cholesterol, Total: 110 mg/dL (ref 100–199)
HDL: 40 mg/dL (ref >39)
LDL Chol Calc (NIH): 49 mg/dL (ref 0–99)
Triglycerides: 114 mg/dL (ref 0–149)
VLDL Cholesterol Cal: 21 mg/dL (ref 5–40)

## 2018-12-22 ENCOUNTER — Telehealth: Payer: Self-pay

## 2018-12-22 NOTE — Telephone Encounter (Signed)
LVMTRC 

## 2018-12-22 NOTE — Telephone Encounter (Signed)
-----   Message from Trinna Post, [place] sent at 12/22/2018  1:29 PM EST ----- His hemoglobin is 6.8 which is absolutely fantastic, the lowest it's ever been and the completely controlled. Keep up the great work and we'll see him back in three months. Cholesterol looks great as well.

## 2018-12-24 NOTE — Telephone Encounter (Signed)
Tried calling patient. Left message to call back. 

## 2018-12-24 NOTE — Telephone Encounter (Signed)
Patient returned call and was advised. Patient states that he reveiwed his results as well on MyChart.

## 2019-01-08 ENCOUNTER — Telehealth: Payer: Self-pay

## 2019-01-12 ENCOUNTER — Telehealth: Payer: Self-pay

## 2019-01-12 ENCOUNTER — Ambulatory Visit: Payer: Self-pay | Admitting: Pharmacist

## 2019-01-12 DIAGNOSIS — J45909 Unspecified asthma, uncomplicated: Secondary | ICD-10-CM

## 2019-01-12 DIAGNOSIS — E1129 Type 2 diabetes mellitus with other diabetic kidney complication: Secondary | ICD-10-CM

## 2019-01-12 NOTE — Chronic Care Management (AMB) (Signed)
  Chronic Care Management   Note  01/12/2019 Name: Aristides Luckey. MRN: [id number] DOB: [birth year]  62 y.o. year old male patient of Carles Collet, [place]. Clinical pharmacy outreach today for 2021 medication assistance plan. Pharmacist assisted patient in applying for assistance for: Breo ([place]), Lantus (Sanofi), and [employer] ([employer]ovo[employer]isk).   Was unable to reach patient via telephone today and have left HIPAA compliant voicemail asking patient to return my call. (unsuccessful outreach #1).  Follow up plan: A HIPPA compliant phone message was left for the patient providing contact information and requesting a return call.  Telephone follow up appointment with care management team member scheduled for:5-7 days  [name], PharmD [place] [phone]

## 2019-01-19 ENCOUNTER — Telehealth: Payer: Self-pay

## 2019-01-19 DIAGNOSIS — Z5181 Encounter for therapeutic drug level monitoring: Secondary | ICD-10-CM | POA: Diagnosis not present

## 2019-01-19 DIAGNOSIS — M47818 Spondylosis without myelopathy or radiculopathy, sacral and sacrococcygeal region: Secondary | ICD-10-CM | POA: Diagnosis not present

## 2019-01-19 DIAGNOSIS — G894 Chronic pain syndrome: Secondary | ICD-10-CM | POA: Diagnosis not present

## 2019-01-19 DIAGNOSIS — F1190 Opioid use, unspecified, uncomplicated: Secondary | ICD-10-CM | POA: Diagnosis not present

## 2019-01-19 DIAGNOSIS — Z79891 Long term (current) use of opiate analgesic: Secondary | ICD-10-CM | POA: Diagnosis not present

## 2019-01-19 DIAGNOSIS — M47819 Spondylosis without myelopathy or radiculopathy, site unspecified: Secondary | ICD-10-CM | POA: Diagnosis not present

## 2019-01-19 DIAGNOSIS — M1610 Unilateral primary osteoarthritis, unspecified hip: Secondary | ICD-10-CM | POA: Diagnosis not present

## 2019-02-02 ENCOUNTER — Other Ambulatory Visit: Payer: Self-pay | Admitting: Psychiatry

## 2019-02-02 DIAGNOSIS — F329 Major depressive disorder, single episode, unspecified: Secondary | ICD-10-CM

## 2019-02-02 DIAGNOSIS — F429 Obsessive-compulsive disorder, unspecified: Secondary | ICD-10-CM

## 2019-02-19 ENCOUNTER — Other Ambulatory Visit: Payer: Self-pay | Admitting: Physician Assistant

## 2019-02-19 DIAGNOSIS — E1129 Type 2 diabetes mellitus with other diabetic kidney complication: Secondary | ICD-10-CM

## 2019-02-19 DIAGNOSIS — Z794 Long term (current) use of insulin: Secondary | ICD-10-CM

## 2019-03-06 ENCOUNTER — Ambulatory Visit (INDEPENDENT_AMBULATORY_CARE_PROVIDER_SITE_OTHER): Payer: Medicare Other | Admitting: Psychiatry

## 2019-03-06 ENCOUNTER — Encounter: Payer: Self-pay | Admitting: Psychiatry

## 2019-03-06 DIAGNOSIS — F329 Major depressive disorder, single episode, unspecified: Secondary | ICD-10-CM | POA: Diagnosis not present

## 2019-03-06 DIAGNOSIS — G4700 Insomnia, unspecified: Secondary | ICD-10-CM | POA: Diagnosis not present

## 2019-03-06 DIAGNOSIS — F429 Obsessive-compulsive disorder, unspecified: Secondary | ICD-10-CM

## 2019-03-06 MED ORDER — MIRTAZAPINE 30 MG PO TABS: 30.0000 mg | ORAL_TABLET | Freq: Every day | ORAL | 1 refills | Status: DC

## 2019-03-06 MED ORDER — SERTRALINE HCL 100 MG PO TABS: 200.0000 mg | ORAL_TABLET | Freq: Every day | ORAL | 1 refills | Status: DC

## 2019-03-06 NOTE — Progress Notes (Signed)
Alan George [id number] [birth year] 64 y.o.  Virtual Visit via Telephone Note  I connected with pt on 03/06/19 at  8:30 AM EST by telephone and verified that I am speaking with the correct person using two identifiers.   I discussed the limitations, risks, security and privacy concerns of performing an evaluation and management service by telephone and the availability of in person appointments. I also discussed with the patient that there may be a patient responsible charge related to this service. The patient expressed understanding and agreed to proceed.   I discussed the assessment and treatment plan with the patient. The patient was provided an opportunity to ask questions and all were answered. The patient agreed with the plan and demonstrated an understanding of the instructions.   The patient was advised to call back or seek an in-person evaluation if the symptoms worsen or if the condition fails to improve as anticipated.  I provided 30 minutes of non-face-to-face time during this encounter.  The patient was located at home.  The provider was located at [place].   Thayer Headings, PMHNP   Subjective:   Patient ID:  Alan George. is a 64 y.o. (DOB [birth year]) male.  Chief Complaint:  Chief Complaint  Patient presents with  . Follow-up    h/o Depression and anxiety    HPI [name]. presents for follow-up of depression and anxiety. He reports that he has not had any significant changes in the last few months and that he typically stays home. He reports, "my mood is pretty much on an even keel." Denies depressed mood. He has not had any recent anxiety. Reports that he no longer has excessive worry about children. Denies compulsive behaviors. Reports that he has noticed increased acceptance of physical issues.   Has had a 30 lb intentional weight loss. He reports that he has made some significant dietary changes. Improved glycemic control and Hgb A1C  has significantly improved. Worked with nutritionist on limiting carbohydrates. Reports that he is eating a full meal daily and then several snacks a day.  He reports that his sleep remains fragmented due to pain and has tried to adapt to this. Energy has been ok. Motivation has been good with increased self-care, medication and dietary compliance, and monitoring glucose control. Denies anhedonia and is enjoying family. He reports adequate concentration. He reports that he is able to complete tasks that he initiates. Occ forgets what he was about to ask someone and then it will come back to him. Denies SI. He reports that he is forward thinking and setting goals.   He reports that they had a good Christmas and family is close. [name] is about to graduate HS. Reports that his 5 children are all doing well.   Review of Systems:  Review of Systems  Endocrine:       Improved glycemic control  Musculoskeletal: Positive for arthralgias and back pain.  Neurological: Positive for tremors.  Psychiatric/Behavioral:       Please refer to HPI    Medications: I have reviewed the patient's current medications.  Current Outpatient Medications  Medication Sig Dispense Refill  . albuterol (PROAIR HFA) 108 (90 Base) MCG/ACT inhaler Inhale 2 puffs into the lungs every 4 (four) hours as needed for wheezing or shortness of breath. 1 Inhaler 5  . atorvastatin (LIPITOR) 80 MG tablet TAKE 1 TABLET(80 MG) BY MOUTH DAILY 90 tablet 2  . cyclobenzaprine (FLEXERIL) 10 MG tablet Take by mouth.    [name]  HYDROmorphone (DILAUDID) 4 MG tablet take 0.5 tablet by mouth every 4 hours if needed  0  . Insulin Glargine (LANTUS SOLOSTAR) 100 UNIT/ML Solostar Pen Inject 30 Units into the skin daily. 30 mL 1  . losartan (COZAAR) 50 MG tablet TAKE 1 TABLET(50 MG) BY MOUTH DAILY 90 tablet 1  . Melatonin 1 MG TABS Take by mouth.    . metFORMIN (GLUCOPHAGE) 1000 MG tablet TAKE 1 TABLET(1000 MG) BY MOUTH TWICE DAILY WITH A MEAL 180 tablet  0  . omeprazole (PRILOSEC) 20 MG capsule TAKE 1 CAPSULE(20 MG) BY MOUTH DAILY 90 capsule 1  . Semaglutide,0.25 or 0.5MG/DOS, (OZEMPIC, 0.25 OR 0.5 MG/DOSE,) 2 MG/1.5ML SOPN Inject 0.5 mg into the skin once a week. 4 pen 1  . sertraline (ZOLOFT) 100 MG tablet Take 2 tablets (200 mg total) by mouth daily. 180 tablet 1  . glucose blood (CONTOUR NEXT TEST) test strip Check blood sugar once daily 100 each 12  . Insulin Pen Needle (NOVOFINE) 32G X 6 MM MISC Use 1 needle per once weekly ozempic injection. 50 each 0  . mirtazapine (REMERON) 30 MG tablet Take 1 tablet (30 mg total) by mouth at bedtime. 90 tablet 1  . NARCAN 4 MG/0.1ML LIQD nasal spray kit      No current facility-administered medications for this visit.    Medication Side Effects: None  Allergies:  Allergies  Allergen Reactions  . Dog Epithelium   . Dust Mite Extract   . Lisinopril Cough  . Pollen Extract   . Tree Extract   . Iodinated Diagnostic Agents Hives    ?gadolinium-thinks it was after MRI  . Other Rash    Past Medical History:  Diagnosis Date  . Allergy   . Asthma   . Diabetes mellitus without complication ([place])   . GERD (gastroesophageal reflux disease)   . Hypertension     Family History  Problem Relation Age of Onset  . Bipolar disorder Mother   . Cancer Mother        BREAST  . Diabetes Father   . Bipolar disorder Brother   . Cancer Paternal Grandfather        LUNG    Social History   Socioeconomic History  . Marital status: Married    Spouse name: Not on file  . Number of children: Not on file  . Years of education: Not on file  . Highest education level: Not on file  Occupational History  . Not on file  Tobacco Use  . Smoking status: Former Research scientist (life sciences)  . Smokeless tobacco: Never Used  Substance and Sexual Activity  . Alcohol use: No    Alcohol/week: 0.0 standard drinks  . Drug use: No  . Sexual activity: Not on file  Other Topics Concern  . Not on file  Social History Narrative  .  Not on file   Social Determinants of Health   Financial Resource Strain:   . Difficulty of Paying Living Expenses: Not on file  Food Insecurity:   . Worried About Charity fundraiser in the Last Year: Not on file  . Ran Out of Food in the Last Year: Not on file  Transportation Needs:   . Lack of Transportation (Medical): Not on file  . Lack of Transportation (Non-Medical): Not on file  Physical Activity:   . Days of Exercise per Week: Not on file  . Minutes of Exercise per Session: Not on file  Stress:   . Feeling of Stress :  Not on file  Social Connections:   . Frequency of Communication with Friends and Family: Not on file  . Frequency of Social Gatherings with Friends and Family: Not on file  . Attends Religious Services: Not on file  . Active Member of Clubs or Organizations: Not on file  . Attends Archivist Meetings: Not on file  . Marital Status: Not on file  Intimate Partner Violence:   . Fear of Current or Ex-Partner: Not on file  . Emotionally Abused: Not on file  . Physically Abused: Not on file  . Sexually Abused: Not on file    Past Medical History, Surgical history, Social history, and Family history were reviewed and updated as appropriate.   Please see review of systems for further details on the patient's review from today.   Objective:   Physical Exam:  Wt 228 lb (103.4 kg)   BMI 30.92 kg/m   Physical Exam Neurological:     Mental Status: He is alert and oriented to person, place, and time.     Cranial Nerves: No dysarthria.  Psychiatric:        Attention and Perception: Attention and perception normal.        Mood and Affect: Mood normal.        Speech: Speech normal.        Behavior: Behavior is cooperative.        Thought Content: Thought content normal. Thought content is not paranoid or delusional. Thought content does not include homicidal or suicidal ideation. Thought content does not include homicidal or suicidal plan.         Cognition and Memory: Cognition and memory normal.        Judgment: Judgment normal.     Comments: Insight intact     Lab Review:     Component Value Date/Time   NA 135 12/19/2018 0859   K 4.7 12/19/2018 0859   CL 95 (L) 12/19/2018 0859   CO2 23 12/19/2018 0859   GLUCOSE 112 (H) 12/19/2018 0859   GLUCOSE 174 (H) 07/03/2016 0938   BUN 19 12/19/2018 0859   CREATININE 1.10 12/19/2018 0859   CREATININE 1.02 07/03/2016 0938   CALCIUM 10.3 (H) 12/19/2018 0859   PROT 7.8 12/19/2018 0859   ALBUMIN 4.7 12/19/2018 0859   AST 23 12/19/2018 0859   ALT 23 12/19/2018 0859   ALKPHOS 69 12/19/2018 0859   BILITOT 0.4 12/19/2018 0859   GFRNONAA 71 12/19/2018 0859   GFRNONAA 80 07/03/2016 0938   GFRAA 82 12/19/2018 0859   GFRAA >89 07/03/2016 0938       Component Value Date/Time   WBC 19.9 (H) 12/05/2017 0939   RBC 5.42 12/05/2017 0939   HGB 16.9 12/05/2017 0939   HCT 49.2 12/05/2017 0939   MCV 91 12/05/2017 0939   MCH 31.2 12/05/2017 0939   MCHC 34.3 12/05/2017 0939   RDW 13.2 12/05/2017 0939   LYMPHSABS 8.5 (H) 12/05/2017 0939   EOSABS 0.2 12/05/2017 0939   BASOSABS 0.1 12/05/2017 0939    No results found for: POCLITH, LITHIUM   No results found for: PHENYTOIN, PHENOBARB, VALPROATE, CBMZ   .res Assessment: Plan:   Patient reports that mood and anxiety signs and symptoms remain well controlled and denies any significant tolerability issues with current medications.  Patient reports that sleep remains fragmented, however he reports sleeping and adequate amount overall and denies feeling excessively tired or sleepy during the day. Continue sertraline 200 mg daily for depression and anxiety. Continue  Remeron 30 mg p.o. nightly for depression and insomnia. Patient to follow-up with this provider in 6 months or sooner if clinically indicated. Patient advised to contact office with any questions, adverse effects, or acute worsening in signs and symptoms.  [place] was seen today for  follow-up.  Diagnoses and all orders for this visit:  Major depressive disorder with current active episode, unspecified depression episode severity, unspecified whether recurrent -     sertraline (ZOLOFT) 100 MG tablet; Take 2 tablets (200 mg total) by mouth daily. -     mirtazapine (REMERON) 30 MG tablet; Take 1 tablet (30 mg total) by mouth at bedtime.  Obsessive-compulsive disorder, unspecified type -     sertraline (ZOLOFT) 100 MG tablet; Take 2 tablets (200 mg total) by mouth daily.  Insomnia, unspecified type -     mirtazapine (REMERON) 30 MG tablet; Take 1 tablet (30 mg total) by mouth at bedtime.    Please see After Visit Summary for patient specific instructions.  Future Appointments  Date Time Provider [place]  04/10/2019  8:00 AM Trinna Post, PA-C [facility]    No orders of the defined types were placed in this encounter.     -------------------------------

## 2019-03-20 ENCOUNTER — Ambulatory Visit: Payer: Self-pay | Admitting: Physician Assistant

## 2019-03-24 ENCOUNTER — Other Ambulatory Visit: Payer: Self-pay | Admitting: Physician Assistant

## 2019-03-24 DIAGNOSIS — Z794 Long term (current) use of insulin: Secondary | ICD-10-CM

## 2019-03-24 DIAGNOSIS — E1129 Type 2 diabetes mellitus with other diabetic kidney complication: Secondary | ICD-10-CM

## 2019-03-24 NOTE — Telephone Encounter (Signed)
Requested medication (s) are due for refill today: Yes  Requested medication (s) are on the active medication list: Yes  Last refill:  03/18/19  Future visit scheduled: Yes  Notes to clinic:  Duplicate request. Prescription has expired.    Requested Prescriptions  Pending Prescriptions Disp Refills   LANTUS SOLOSTAR 100 UNIT/ML Solostar Pen [Pharmacy Med Name: LANTUS SOLOSTAR PEN INJ 3ML] 30 mL 1    Sig: ADMINISTER 30 UNITS UNDER THE SKIN DAILY      Endocrinology:  Diabetes - Insulins Passed - 03/24/2019 10:08 AM      Passed - HBA1C is between 0 and 7.9 and within 180 days    Hemoglobin A1C  Date Value Ref Range Status  12/19/2018   Final    Comment:    >13   Hgb A1c MFr Bld  Date Value Ref Range Status  12/19/2018 6.8 (H) 4.8 - 5.6 % Final    Comment:             Prediabetes: 5.7 - 6.4          Diabetes: >6.4          Glycemic control for adults with diabetes: <7.0           Passed - Valid encounter within last 6 months    Recent Outpatient Visits           3 months ago Type 2 diabetes mellitus with microalbuminuria, with long-term current use of insulin [facility])   [facility] [place], Adriana M, PA-C   6 months ago Type 2 diabetes mellitus with microalbuminuria, with long-term current use of insulin [facility])   [facility] [place], Adriana M, PA-C   8 months ago Type 2 diabetes mellitus with microalbuminuria, with long-term current use of insulin [facility])   [facility] [place], [place], PA-C   1 year ago Type 2 diabetes mellitus with microalbuminuria, with long-term current use of insulin [facility])   [facility] [place], [place], [place]   1 year ago Asthma, unspecified asthma severity, unspecified whether complicated, unspecified whether persistent   [facility] [place], Lavella [place], PA-C       Future Appointments             In 2 weeks Trey Sailors, PA-C [facility], [facility]

## 2019-03-24 NOTE — Telephone Encounter (Signed)
Requested medication (s) are due for refill today: Yes  Requested medication (s) are on the active medication list: Yes  Last refill:  03/17/18  Future visit scheduled: Yes  Notes to clinic:  Prescription has expired.    Requested Prescriptions  Pending Prescriptions Disp Refills   LANTUS SOLOSTAR 100 UNIT/ML Solostar Pen [Pharmacy Med Name: LANTUS SOLOSTAR PEN INJ 3ML] 30 mL 1    Sig: ADMINISTER 30 UNITS UNDER THE SKIN DAILY      Endocrinology:  Diabetes - Insulins Passed - 03/24/2019  9:53 AM      Passed - HBA1C is between 0 and 7.9 and within 180 days    Hemoglobin A1C  Date Value Ref Range Status  12/19/2018   Final    Comment:    >13   Hgb A1c MFr Bld  Date Value Ref Range Status  12/19/2018 6.8 (H) 4.8 - 5.6 % Final    Comment:             Prediabetes: 5.7 - 6.4          Diabetes: >6.4          Glycemic control for adults with diabetes: <7.0           Passed - Valid encounter within last 6 months    Recent Outpatient Visits           3 months ago Type 2 diabetes mellitus with microalbuminuria, with long-term current use of insulin [facility])   [facility] [place], Adriana M, PA-C   6 months ago Type 2 diabetes mellitus with microalbuminuria, with long-term current use of insulin [facility])   [facility] [place], Adriana M, PA-C   8 months ago Type 2 diabetes mellitus with microalbuminuria, with long-term current use of insulin [facility])   [facility] [place], [place], PA-C   1 year ago Type 2 diabetes mellitus with microalbuminuria, with long-term current use of insulin [facility])   [facility] [place], [place], [place]   1 year ago Asthma, unspecified asthma severity, unspecified whether complicated, unspecified whether persistent   [facility] [place], Lavella Hammock, PA-C       Future Appointments             In 2 weeks Trey Sailors, PA-C [facility]

## 2019-03-24 NOTE — Telephone Encounter (Signed)
L.O.V. was on 12/19/2018 and upcoming appointment on 04/10/2019.

## 2019-04-02 LAB — HM DIABETES EYE EXAM

## 2019-04-10 ENCOUNTER — Ambulatory Visit: Payer: Medicare Other | Admitting: Physician Assistant

## 2019-04-14 ENCOUNTER — Other Ambulatory Visit: Payer: Self-pay

## 2019-04-14 ENCOUNTER — Encounter: Payer: Self-pay | Admitting: Physician Assistant

## 2019-04-14 ENCOUNTER — Ambulatory Visit (INDEPENDENT_AMBULATORY_CARE_PROVIDER_SITE_OTHER): Payer: Medicare Other | Admitting: Physician Assistant

## 2019-04-14 VITALS — BP 109/78 | HR 90 | Temp 96.9°F | Resp 16 | Ht 72.0 in | Wt 230.0 lb

## 2019-04-14 DIAGNOSIS — Z794 Long term (current) use of insulin: Secondary | ICD-10-CM | POA: Diagnosis not present

## 2019-04-14 DIAGNOSIS — R809 Proteinuria, unspecified: Secondary | ICD-10-CM | POA: Diagnosis not present

## 2019-04-14 DIAGNOSIS — I10 Essential (primary) hypertension: Secondary | ICD-10-CM | POA: Diagnosis not present

## 2019-04-14 DIAGNOSIS — E785 Hyperlipidemia, unspecified: Secondary | ICD-10-CM

## 2019-04-14 DIAGNOSIS — E1129 Type 2 diabetes mellitus with other diabetic kidney complication: Secondary | ICD-10-CM | POA: Diagnosis not present

## 2019-04-14 DIAGNOSIS — E1169 Type 2 diabetes mellitus with other specified complication: Secondary | ICD-10-CM | POA: Diagnosis not present

## 2019-04-14 LAB — POCT GLYCOSYLATED HEMOGLOBIN (HGB A1C)
Est. average glucose Bld gHb Est-mCnc: 111
Hemoglobin A1C: 5.5 % (ref 4.0–5.6)

## 2019-04-14 MED ORDER — OZEMPIC (0.25 OR 0.5 MG/DOSE) 2 MG/1.5ML ~~LOC~~ SOPN: 0.5000 mg | PEN_INJECTOR | SUBCUTANEOUS | 1 refills | Status: DC

## 2019-04-14 MED ORDER — METFORMIN HCL 1000 MG PO TABS: 1000.0000 mg | ORAL_TABLET | Freq: Two times a day (BID) | ORAL | 3 refills | Status: DC

## 2019-04-14 NOTE — Patient Instructions (Signed)
Diabetes Mellitus and Exercise Exercising regularly is important for your overall health, especially when you have diabetes (diabetes mellitus). Exercising is not only about losing weight. It has many other health benefits, such as increasing muscle strength and bone density and reducing body fat and stress. This leads to improved fitness, flexibility, and endurance, all of which result in better overall health. Exercise has additional benefits for people with diabetes, including:  Reducing appetite.  Helping to lower and control blood glucose.  Lowering blood pressure.  Helping to control amounts of fatty substances (lipids) in the blood, such as cholesterol and triglycerides.  Helping the body to respond better to insulin (improving insulin sensitivity).  Reducing how much insulin the body needs.  Decreasing the risk for heart disease by: ? Lowering cholesterol and triglyceride levels. ? Increasing the levels of good cholesterol. ? Lowering blood glucose levels. What is my activity plan? Your health care provider or certified diabetes educator can help you make a plan for the type and frequency of exercise (activity plan) that works for you. Make sure that you:  Do at least 150 minutes of moderate-intensity or vigorous-intensity exercise each week. This could be brisk walking, biking, or water aerobics. ? Do stretching and strength exercises, such as yoga or weightlifting, at least 2 times a week. ? Spread out your activity over at least 3 days of the week.  Get some form of physical activity every day. ? Do not go more than 2 days in a row without some kind of physical activity. ? Avoid being inactive for more than 30 minutes at a time. Take frequent breaks to walk or stretch.  Choose a type of exercise or activity that you enjoy, and set realistic goals.  Start slowly, and gradually increase the intensity of your exercise over time. What do I need to know about managing my  diabetes?   Check your blood glucose before and after exercising. ? If your blood glucose is 240 mg/dL (13.3 mmol/L) or higher before you exercise, check your urine for ketones. If you have ketones in your urine, do not exercise until your blood glucose returns to normal. ? If your blood glucose is 100 mg/dL (5.6 mmol/L) or lower, eat a snack containing 15-20 grams of carbohydrate. Check your blood glucose 15 minutes after the snack to make sure that your level is above 100 mg/dL (5.6 mmol/L) before you start your exercise.  Know the symptoms of low blood glucose (hypoglycemia) and how to treat it. Your risk for hypoglycemia increases during and after exercise. Common symptoms of hypoglycemia can include: ? Hunger. ? Anxiety. ? Sweating and feeling clammy. ? Confusion. ? Dizziness or feeling light-headed. ? Increased heart rate or palpitations. ? Blurry vision. ? Tingling or numbness around the mouth, lips, or tongue. ? Tremors or shakes. ? Irritability.  Keep a rapid-acting carbohydrate snack available before, during, and after exercise to help prevent or treat hypoglycemia.  Avoid injecting insulin into areas of the body that are going to be exercised. For example, avoid injecting insulin into: ? The arms, when playing tennis. ? The legs, when jogging.  Keep records of your exercise habits. Doing this can help you and your health care provider adjust your diabetes management plan as needed. Write down: ? Food that you eat before and after you exercise. ? Blood glucose levels before and after you exercise. ? The type and amount of exercise you have done. ? When your insulin is expected to peak, if you use   insulin. Avoid exercising at times when your insulin is peaking.  When you start a new exercise or activity, work with your health care provider to make sure the activity is safe for you, and to adjust your insulin, medicines, or food intake as needed.  Drink plenty of water while  you exercise to prevent dehydration or heat stroke. Drink enough fluid to keep your urine clear or pale yellow. Summary  Exercising regularly is important for your overall health, especially when you have diabetes (diabetes mellitus).  Exercising has many health benefits, such as increasing muscle strength and bone density and reducing body fat and stress.  Your health care provider or certified diabetes educator can help you make a plan for the type and frequency of exercise (activity plan) that works for you.  When you start a new exercise or activity, work with your health care provider to make sure the activity is safe for you, and to adjust your insulin, medicines, or food intake as needed. This information is not intended to replace advice given to you by your health care provider. Make sure you discuss any questions you have with your health care provider. Document Revised: 08/30/2016 Document Reviewed: 07/18/2015 Elsevier Patient Education  2020 [employer]  

## 2019-04-14 NOTE — Progress Notes (Signed)
Patient: Alan George. Male    DOB: 03/28/1955   64 y.o.   MRN: [id number] Visit Date: 04/14/2019  Today's Provider: Trinna Post, PA-C   Chief Complaint  Patient presents with  . Diabetes  . Hypertension  . Hyperlipidemia   Subjective:    I [place] S. Dimas, CMA, am acting as scribe for [employer], PA-C.   HPI  Diabetes Mellitus Type II, Follow-up:   Lab Results  Component Value Date   HGBA1C 5.5 04/14/2019   HGBA1C 6.8 (H) 12/19/2018   HGBA1C  12/19/2018     Comment:     >13   Last seen for diabetes 3 months ago.  Management since then includes no changes. Check labs. Patient advised to have eye exam done, referral placed. He reports excellent compliance with treatment. He is not having side effects.  Current symptoms include none and have been stable. Home blood sugar records: fasting range: 80-100  Episodes of hypoglycemia? no   Current Insulin Regimen: 30 units daily Most Recent Eye Exam: last week [facility] Weight trend: stable Prior visit with dietician: no Current diet: not asked Current exercise: none  ------------------------------------------------------------------------   Hypertension, follow-up:  BP Readings from Last 3 Encounters:  04/14/19 109/78  12/19/18 130/78  08/29/18 138/88    He was last seen for hypertension 3 months ago.  BP at that visit was 130/78. Management since that visit includes no changes.He reports excellent compliance with treatment. He is not having side effects.  He is not exercising. He is adherent to low salt diet.   Outside blood pressures are stable. He is experiencing none.  Patient denies chest pain.   Cardiovascular risk factors include advanced age (older than 67 for men, 72 for women), diabetes mellitus, dyslipidemia, hypertension, male gender and smoking/ tobacco exposure.  Use of agents associated with hypertension: none.    ------------------------------------------------------------------------    Lipid/Cholesterol, Follow-up:   Last seen for this 3 months ago.  Management since that visit includes no changes.  Last Lipid Panel:    Component Value Date/Time   CHOL 110 12/19/2018 0859   TRIG 114 12/19/2018 0859   HDL 40 12/19/2018 0859   CHOLHDL 2.8 12/19/2018 0859   CHOLHDL 2.9 12/19/2016 0950   VLDL 25 07/03/2016 0938   LDLCALC 49 12/19/2018 0859   LDLCALC 68 12/19/2016 0950    He reports good compliance with treatment. He is not having side effects.   Wt Readings from Last 3 Encounters:  04/14/19 230 lb (104.3 kg)  12/19/18 234 lb 12.8 oz (106.5 kg)  08/29/18 240 lb (108.9 kg)    ------------------------------------------------------------------------    Allergies  Allergen Reactions  . Dog Epithelium   . Dust Mite Extract   . Lisinopril Cough  . Pollen Extract   . Tree Extract   . Iodinated Diagnostic Agents Hives    ?gadolinium-thinks it was after MRI  . Other Rash     Current Outpatient Medications:  .  albuterol (PROAIR HFA) 108 (90 Base) MCG/ACT inhaler, Inhale 2 puffs into the lungs every 4 (four) hours as needed for wheezing or shortness of breath., Disp: 1 Inhaler, Rfl: 5 .  atorvastatin (LIPITOR) 80 MG tablet, TAKE 1 TABLET(80 MG) BY MOUTH DAILY, Disp: 90 tablet, Rfl: 2 .  cyclobenzaprine (FLEXERIL) 10 MG tablet, Take by mouth., Disp: , Rfl:  .  glucose blood (CONTOUR NEXT TEST) test strip, Check blood sugar once daily, Disp: 100 each, Rfl: 12 .  HYDROmorphone (DILAUDID) 4 MG tablet, take 0.5 tablet by mouth every 4 hours if needed, Disp: , Rfl: 0 .  Insulin Pen Needle (NOVOFINE) 32G X 6 MM MISC, Use 1 needle per once weekly ozempic injection., Disp: 50 each, Rfl: 0 .  LANTUS SOLOSTAR 100 UNIT/ML Solostar Pen, ADMINISTER 30 UNITS UNDER THE SKIN DAILY, Disp: 30 mL, Rfl: 1 .  losartan (COZAAR) 50 MG tablet, TAKE 1 TABLET(50 MG) BY MOUTH DAILY, Disp: 90 tablet, Rfl:  1 .  Melatonin 1 MG TABS, Take by mouth., Disp: , Rfl:  .  metFORMIN (GLUCOPHAGE) 1000 MG tablet, TAKE 1 TABLET(1000 MG) BY MOUTH TWICE DAILY WITH A MEAL, Disp: 180 tablet, Rfl: 0 .  mirtazapine (REMERON) 30 MG tablet, Take 1 tablet (30 mg total) by mouth at bedtime., Disp: 90 tablet, Rfl: 1 .  NARCAN 4 MG/0.1ML LIQD nasal spray kit, , Disp: , Rfl:  .  omeprazole (PRILOSEC) 20 MG capsule, TAKE 1 CAPSULE(20 MG) BY MOUTH DAILY, Disp: 90 capsule, Rfl: 1 .  Semaglutide,0.25 or 0.5MG/DOS, (OZEMPIC, 0.25 OR 0.5 MG/DOSE,) 2 MG/1.5ML SOPN, Inject 0.5 mg into the skin once a week., Disp: 4 pen, Rfl: 1 .  sertraline (ZOLOFT) 100 MG tablet, Take 2 tablets (200 mg total) by mouth daily., Disp: 180 tablet, Rfl: 1  Review of Systems  Constitutional: Negative.   Eyes: Negative.   Respiratory: Negative.   Cardiovascular: Negative.   Endocrine: Negative.     Social History   Tobacco Use  . Smoking status: Former Research scientist (life sciences)  . Smokeless tobacco: Never Used  Substance Use Topics  . Alcohol use: No    Alcohol/week: 0.0 standard drinks      Objective:   BP 109/78 (BP Location: Right Arm, Patient Position: Sitting, Cuff Size: Normal)   Pulse 90   Temp (!) 96.9 F (36.1 C) (Temporal)   Resp 16   Ht 6' (1.829 m)   Wt 230 lb (104.3 kg)   BMI 31.19 kg/m  Vitals:   04/14/19 0924  BP: 109/78  Pulse: 90  Resp: 16  Temp: (!) 96.9 F (36.1 C)  TempSrc: Temporal  Weight: 230 lb (104.3 kg)  Height: 6' (1.829 m)  Body mass index is 31.19 kg/m.   Physical Exam Constitutional:      Appearance: Normal appearance.  Cardiovascular:     Rate and Rhythm: Normal rate and regular rhythm.     Heart sounds: Normal heart sounds.  Pulmonary:     Effort: Pulmonary effort is normal.     Breath sounds: Normal breath sounds.  Skin:    General: Skin is warm and dry.  Neurological:     Mental Status: He is alert and oriented to person, place, and time. Mental status is at baseline.  Psychiatric:        Mood  and Affect: Mood normal.        Behavior: Behavior normal.      Results for orders placed or performed in visit on 04/14/19  POCT glycosylated hemoglobin (Hb A1C)  Result Value Ref Range   Hemoglobin A1C 5.5 4.0 - 5.6 %   Est. average glucose Bld gHb Est-mCnc 111        Assessment & Plan    1. Type 2 diabetes mellitus with microalbuminuria, with long-term current use of insulin (HCC)  Sugars are exceptionally well controlled. Patient congratulated on efforts. Follow up in 3 months.   - POCT glycosylated hemoglobin (Hb A1C) - metFORMIN (GLUCOPHAGE) 1000 MG tablet; Take 1 tablet (1,000  mg total) by mouth 2 (two) times daily with a meal.  Dispense: 180 tablet; Refill: 3 - Semaglutide,0.25 or 0.5MG/DOS, (OZEMPIC, 0.25 OR 0.5 MG/DOSE,) 2 MG/1.5ML SOPN; Inject 0.5 mg into the skin once a week.  Dispense: 4 pen; Refill: 1  2. Essential hypertension  Continue medications.  3. Hyperlipidemia associated with type 2 diabetes mellitus ([place])  Continue medications.   The entirety of the information documented in the History of Present Illness, Review of Systems and Physical Exam were personally obtained by me. Portions of this information were initially documented by Lynford Humphrey, CMA and reviewed by me for thoroughness and accuracy.       Trinna Post, PA-C  [facility]

## 2019-04-22 ENCOUNTER — Other Ambulatory Visit: Payer: Self-pay | Admitting: Physician Assistant

## 2019-04-22 DIAGNOSIS — E1129 Type 2 diabetes mellitus with other diabetic kidney complication: Secondary | ICD-10-CM

## 2019-04-22 DIAGNOSIS — R059 Cough, unspecified: Secondary | ICD-10-CM

## 2019-04-22 DIAGNOSIS — I10 Essential (primary) hypertension: Secondary | ICD-10-CM

## 2019-04-22 DIAGNOSIS — R05 Cough: Secondary | ICD-10-CM

## 2019-04-22 NOTE — Telephone Encounter (Signed)
Requested medication (s) are due for refill today:   Losartan - yes;   Breo not on medication list  Requested medication (s) are on the active medication list:   Losartan - yes;   Breo - no  Future visit scheduled:   Yes   Last ordered: Losartan ok to fill;   Breo 60 each, 1 ordered on 07/24/2018.  Expired 10/22/2018  Clinic note:  Wasn't sure what to do with [facility].      Requested Prescriptions  Pending Prescriptions Disp Refills   losartan (COZAAR) 50 MG tablet [Pharmacy Med Name: LOSARTAN 50MG  TABLETS] 90 tablet 1    Sig: TAKE 1 TABLET(50 MG) BY MOUTH DAILY      Cardiovascular:  Angiotensin Receptor Blockers Passed - 04/22/2019  3:29 AM      Passed - Cr in normal range and within 180 days    Creat  Date Value Ref Range Status  07/03/2016 1.02 0.70 - 1.25 mg/dL Final    Comment:      For patients > or = 64 years of age: The upper reference limit for Creatinine is approximately 13% higher for people identified as African-American.      Creatinine, Ser  Date Value Ref Range Status  12/19/2018 1.10 0.76 - 1.27 mg/dL Final   Creatinine, Urine  Date Value Ref Range Status  12/19/2016 207 20 - 320 mg/dL Final          Passed - K in normal range and within 180 days    Potassium  Date Value Ref Range Status  12/19/2018 4.7 3.5 - 5.2 mmol/L Final          Passed - Patient is not pregnant      Passed - Last BP in normal range    BP Readings from Last 1 Encounters:  04/14/19 109/78          Passed - Valid encounter within last 6 months    Recent Outpatient Visits           1 week ago Type 2 diabetes mellitus with microalbuminuria, with long-term current use of insulin [facility])   [place], Adriana M, PA-C   4 months ago Type 2 diabetes mellitus with microalbuminuria, with long-term current use of insulin [facility])   [facility] [place], [place] M, PA-C   7 months ago Type 2 diabetes mellitus with microalbuminuria, with long-term current  use of insulin [facility])   [facility] [place], Adriana M, PA-C   9 months ago Type 2 diabetes mellitus with microalbuminuria, with long-term current use of insulin [facility])   [facility] [place], [place], [place]   1 year ago Type 2 diabetes mellitus with microalbuminuria, with long-term current use of insulin [facility])   [facility] Trinna Post, [place]       Future Appointments             In 2 months [place], Adriana M, PA-C [employer], [facility]              BREO ELLIPTA 100-25 MCG/INH AEPB [employer] Med Name: BREO ELLIPTA 100-25MCG ORAL INH(30)] 60 each 1    Sig: INHALE 1 PUFF INTO THE LUNGS DAILY      Pulmonology:  [employer] Passed - 04/22/2019  3:29 AM      Passed - Valid encounter within last 12 months    Recent Outpatient Visits           1 week ago  Type 2 diabetes mellitus with microalbuminuria, with long-term current use of insulin [facility])   [facility] [place], [place] M, PA-C   4 months ago Type 2 diabetes mellitus with microalbuminuria, with long-term current use of insulin [facility])   [facility] [place], [place] M, [place]   7 months ago Type 2 diabetes mellitus with microalbuminuria, with long-term current use of insulin [facility])   [facility] [place], Ricki Rodriguez M, [place]   9 months ago Type 2 diabetes mellitus with microalbuminuria, with long-term current use of insulin [facility])   [facility] [place], [place], [place]   1 year ago Type 2 diabetes mellitus with microalbuminuria, with long-term current use of insulin [facility])   [facility] Trey Sailors, [place]       Future Appointments             In 2 months Trey Sailors, PA-C [employer], [facility]

## 2019-05-06 DIAGNOSIS — M25562 Pain in left knee: Secondary | ICD-10-CM | POA: Diagnosis not present

## 2019-05-06 DIAGNOSIS — M5442 Lumbago with sciatica, left side: Secondary | ICD-10-CM | POA: Diagnosis not present

## 2019-05-06 DIAGNOSIS — M25552 Pain in left hip: Secondary | ICD-10-CM | POA: Diagnosis not present

## 2019-05-06 DIAGNOSIS — M25561 Pain in right knee: Secondary | ICD-10-CM | POA: Diagnosis not present

## 2019-05-06 DIAGNOSIS — Z5181 Encounter for therapeutic drug level monitoring: Secondary | ICD-10-CM | POA: Diagnosis not present

## 2019-05-06 DIAGNOSIS — Z79891 Long term (current) use of opiate analgesic: Secondary | ICD-10-CM | POA: Diagnosis not present

## 2019-05-06 DIAGNOSIS — G8929 Other chronic pain: Secondary | ICD-10-CM | POA: Diagnosis not present

## 2019-05-19 ENCOUNTER — Other Ambulatory Visit: Payer: Self-pay | Admitting: Physician Assistant

## 2019-05-19 DIAGNOSIS — K219 Gastro-esophageal reflux disease without esophagitis: Secondary | ICD-10-CM

## 2019-05-19 NOTE — Telephone Encounter (Signed)
Requested Prescriptions  Pending Prescriptions Disp Refills  . omeprazole (PRILOSEC) 20 MG capsule [Pharmacy Med Name: OMEPRAZOLE 20MG  CAPSULES] 90 capsule 1    Sig: TAKE 1 CAPSULE(20 MG) BY MOUTH DAILY     Gastroenterology: Proton Pump Inhibitors Passed - 05/19/2019 10:15 AM      Passed - Valid encounter within last 12 months    Recent Outpatient Visits          1 month ago Type 2 diabetes mellitus with microalbuminuria, with long-term current use of insulin [facility])   [facility] [place], Adriana M, PA-C   5 months ago Type 2 diabetes mellitus with microalbuminuria, with long-term current use of insulin [facility])   [facility] [place], [place] M, PA-C   8 months ago Type 2 diabetes mellitus with microalbuminuria, with long-term current use of insulin [facility])   [facility] [place], [place] M, PA-C   10 months ago Type 2 diabetes mellitus with microalbuminuria, with long-term current use of insulin [facility])   [facility] [place], [place], [place]   1 year ago Type 2 diabetes mellitus with microalbuminuria, with long-term current use of insulin [facility])   [facility] [place], [place], Lavella Hammock      Future Appointments            In 1 month [place], Jodi Marble, PA-C [place] [place], [facility]

## 2019-06-16 ENCOUNTER — Other Ambulatory Visit: Payer: Self-pay | Admitting: Physician Assistant

## 2019-06-16 DIAGNOSIS — R05 Cough: Secondary | ICD-10-CM

## 2019-06-16 DIAGNOSIS — R059 Cough, unspecified: Secondary | ICD-10-CM

## 2019-06-16 NOTE — Telephone Encounter (Signed)
Requested medications are due for refill today?  No   Requested medications are on active medication list?  Yes  Last Refill:  04/22/2019 60 each with one refill.  Starting 04/22/2019 and ending 07/21/2019.    Future visit scheduled?  Yes  Notes to Clinic:  Did not want to refuse this refill.  Patient should have enough medication to get him through the month of May.  When I selected edit rx - a message comes up indicating that the new duration is 90 days and the sig will need to be changed.  Please review.  Patient has an appointment with Dr. Jodi Marble in 4 weeks.

## 2019-07-02 ENCOUNTER — Encounter: Payer: Self-pay | Admitting: Physician Assistant

## 2019-07-02 DIAGNOSIS — Z794 Long term (current) use of insulin: Secondary | ICD-10-CM

## 2019-07-02 DIAGNOSIS — R809 Proteinuria, unspecified: Secondary | ICD-10-CM

## 2019-07-02 DIAGNOSIS — E1129 Type 2 diabetes mellitus with other diabetic kidney complication: Secondary | ICD-10-CM

## 2019-07-02 MED ORDER — LANTUS SOLOSTAR 100 UNIT/ML ~~LOC~~ SOPN: PEN_INJECTOR | SUBCUTANEOUS | 1 refills | Status: DC

## 2019-07-15 ENCOUNTER — Ambulatory Visit: Payer: Self-pay | Admitting: Physician Assistant

## 2019-07-16 ENCOUNTER — Telehealth: Payer: Self-pay | Admitting: Physician Assistant

## 2019-07-16 NOTE — Telephone Encounter (Signed)
Can we call patient and let him know I have the novonordisk application. I've filled out the info for ozempic which is a product of this compnay. However, his basal insulin lantus is not part of this company. Is that something he still needs assistance on? If it is, we can change to Levemir which is basal insulin under novonordisk.

## 2019-07-16 NOTE — Telephone Encounter (Signed)
Called patient and no answer, left voicemail for patient to return call. If patient returns call okay for [facility] to advise patient of message and see if he would like to change insulin.

## 2019-07-17 NOTE — Telephone Encounter (Signed)
Attempted to call patient with PCP response/message- left message on VM to return office call.

## 2019-07-17 NOTE — Telephone Encounter (Signed)
Advised patient via detailed message per [facility] and stated if he has any questions or concerns he may contact the office.

## 2019-07-17 NOTE — Telephone Encounter (Signed)
OK we can submit form for the ozempic but his lantus does not get assistance with this form.

## 2019-07-17 NOTE — Telephone Encounter (Signed)
PT wants to keep the same medication

## 2019-07-29 NOTE — Progress Notes (Signed)
I,Roshena L Chambers,acting as a scribe for Trinna Post, PA-C.,have documented all relevant documentation on the behalf of Trinna Post, PA-C,as directed by  Trinna Post, PA-C while in the presence of Trinna Post, PA-C.  Established patient visit   Patient: Alan George.   DOB: Dec 09, 1955   64 y.o. Male  MRN: [id number] Visit Date: 07/30/2019  Today's healthcare provider: Trinna Post, PA-C   Chief Complaint  Patient presents with  . Diabetes   Subjective    HPI Diabetes Mellitus Type II, Follow-up  Lab Results  Component Value Date   HGBA1C 5.9 (A) 07/30/2019   HGBA1C 5.5 04/14/2019   HGBA1C 6.8 (H) 12/19/2018   Wt Readings from Last 3 Encounters:  07/30/19 230 lb (104.3 kg)  04/14/19 230 lb (104.3 kg)  12/19/18 234 lb 12.8 oz (106.5 kg)   Last seen for diabetes 3 months ago.  Management since then includes no changes. He reports good compliance with treatment.  He has not been taking Ozempic for over 1 month due to cost.  He is not having side effects.  Symptoms: No fatigue No foot ulcerations  No appetite changes No nausea  No paresthesia of the feet  No polydipsia  No polyuria No visual disturbances   No vomiting     Home blood sugar records: random glucoose average 80-90  Episodes of hypoglycemia? No    Current insulin regiment: Lantus 26 units daily Most Recent Eye Exam: 04/02/2019 Current exercise: none Current diet habits: well balanced  Pertinent Labs: Lab Results  Component Value Date   CHOL 110 12/19/2018   HDL 40 12/19/2018   LDLCALC 49 12/19/2018   TRIG 114 12/19/2018   CHOLHDL 2.8 12/19/2018   Lab Results  Component Value Date   NA 135 12/19/2018   K 4.7 12/19/2018   CREATININE 1.10 12/19/2018   GFRNONAA 71 12/19/2018   GFRAA 82 12/19/2018   GLUCOSE 112 (H) 12/19/2018     ---------------------------------------------------------------------------------------------------  Hypertension, follow-up  BP  Readings from Last 3 Encounters:  07/30/19 120/80  04/14/19 109/78  12/19/18 130/78   Wt Readings from Last 3 Encounters:  07/30/19 230 lb (104.3 kg)  04/14/19 230 lb (104.3 kg)  12/19/18 234 lb 12.8 oz (106.5 kg)     He was last seen for hypertension 3 months ago.  BP at that visit was 109/78. Management since that visit includes no changes.  He reports good compliance with treatment. He is not having side effects.  He is following a Regular diet. He is not exercising. He does not smoke.  Use of agents associated with hypertension: none.   Symptoms: No chest pain No chest pressure  No palpitations No syncope  No dyspnea No orthopnea  No paroxysmal nocturnal dyspnea No lower extremity edema   Pertinent labs: Lab Results  Component Value Date   CHOL 110 12/19/2018   HDL 40 12/19/2018   LDLCALC 49 12/19/2018   TRIG 114 12/19/2018   CHOLHDL 2.8 12/19/2018   Lab Results  Component Value Date   NA 135 12/19/2018   K 4.7 12/19/2018   CREATININE 1.10 12/19/2018   GFRNONAA 71 12/19/2018   GFRAA 82 12/19/2018   GLUCOSE 112 (H) 12/19/2018     The ASCVD Risk score (Goff DC Jr., et al., 2013) failed to calculate for the following reasons:   The valid total cholesterol range is 130 to 320 mg/dL   ---------------------------------------------------------------------------------------------------  Lipid/Cholesterol, Follow-up  Last lipid panel Other  pertinent labs  Lab Results  Component Value Date   CHOL 110 12/19/2018   HDL 40 12/19/2018   LDLCALC 49 12/19/2018   TRIG 114 12/19/2018   CHOLHDL 2.8 12/19/2018   Lab Results  Component Value Date   ALT 23 12/19/2018   AST 23 12/19/2018     He was last seen for this 3 months ago.  Management since that visit includes no changes.  He reports good compliance with treatment. He is not having side effects.   Symptoms: No chest pain No chest pressure/discomfort  No dyspnea No lower extremity edema  No numbness or  tingling of extremity No orthopnea  No palpitations No paroxysmal nocturnal dyspnea  No speech difficulty No syncope   Current diet: well balanced Current exercise: none  The ASCVD Risk score ([place]., et al., 2013) failed to calculate for the following reasons:   The valid total cholesterol range is 130 to 320 mg/dL    Bilateral Chronic knee pain: Patient reports worsening knee pain. He would like referral to Physical therapy.       Medications: Outpatient Medications Prior to Visit  Medication Sig  . albuterol (PROAIR HFA) 108 (90 Base) MCG/ACT inhaler Inhale 2 puffs into the lungs every 4 (four) hours as needed for wheezing or shortness of breath.  [name] atorvastatin (LIPITOR) 80 MG tablet TAKE 1 TABLET(80 MG) BY MOUTH DAILY  . BREO ELLIPTA 100-25 MCG/INH AEPB INHALE 1 PUFF INTO THE LUNGS DAILY  . cyclobenzaprine (FLEXERIL) 10 MG tablet Take by mouth.  [name] glucose blood (CONTOUR NEXT TEST) test strip Check blood sugar once daily  . HYDROmorphone (DILAUDID) 4 MG tablet take 0.5 tablet by mouth every 4 hours if needed  . Insulin Pen Needle (NOVOFINE) 32G X 6 MM MISC Use 1 needle per once weekly ozempic injection.  [name] losartan (COZAAR) 50 MG tablet TAKE 1 TABLET(50 MG) BY MOUTH DAILY  . Melatonin 1 MG TABS Take by mouth.  . metFORMIN (GLUCOPHAGE) 1000 MG tablet Take 1 tablet (1,000 mg total) by mouth 2 (two) times daily with a meal.  . mirtazapine (REMERON) 30 MG tablet Take 1 tablet (30 mg total) by mouth at bedtime.  [name] NARCAN 4 MG/0.1ML LIQD nasal spray kit   . omeprazole (PRILOSEC) 20 MG capsule TAKE 1 CAPSULE(20 MG) BY MOUTH DAILY  . sertraline (ZOLOFT) 100 MG tablet Take 2 tablets (200 mg total) by mouth daily.  . [DISCONTINUED] insulin glargine (LANTUS SOLOSTAR) 100 UNIT/ML Solostar Pen ADMINISTER 30 UNITS UNDER THE SKIN DAILY  . gabapentin (NEURONTIN) 300 MG capsule TAKE 1 CAPSULE BY MOUTH AT BEDTIME FOR 1 WEEK. INCREASE TO 600 MG AT BEDTIME (Patient not taking: Reported on  07/30/2019)  . [DISCONTINUED] Semaglutide,0.25 or 0.5MG/DOS, (OZEMPIC, 0.25 OR 0.5 MG/DOSE,) 2 MG/1.5ML SOPN Inject 0.5 mg into the skin once a week. (Patient not taking: Reported on 07/30/2019)   No facility-administered medications prior to visit.    Review of Systems  Constitutional: Negative.   Respiratory: Negative.   Cardiovascular: Negative.   Hematological: Negative.       Objective    BP 120/80 (BP Location: Right Arm)   Pulse 63   Temp (!) 96.9 F (36.1 C) (Temporal)   Resp 16   Wt 230 lb (104.3 kg)   SpO2 98% Comment: room air  BMI 31.19 kg/m    Physical Exam Constitutional:      Appearance: Normal appearance.  Cardiovascular:     Rate and Rhythm: Normal rate and regular  rhythm.     Heart sounds: Normal heart sounds.  Pulmonary:     Effort: Pulmonary effort is normal.     Breath sounds: Normal breath sounds.  Feet:     Comments: Slightly reduced sensation bilaterally Skin:    General: Skin is warm and dry.  Neurological:     Mental Status: He is alert and oriented to person, place, and time. Mental status is at baseline.  Psychiatric:        Mood and Affect: Mood normal.        Behavior: Behavior normal.       Results for orders placed or performed in visit on 07/30/19  POCT HgB A1C  Result Value Ref Range   Hemoglobin A1C 5.9 (A) 4.0 - 5.6 %   Est. average glucose Bld gHb Est-mCnc 123     Assessment & Plan     1. Type 2 diabetes mellitus with microalbuminuria, with long-term current use of insulin (HCC) Well controlled with todays  A1c 5.9. Has been off Ozempic for the past month due to cost. Will discontinue Ozempic since blood sugars are well controlled. Continue Lantus and Metformin. Refill sent into pharmacy. Patient will check to see if Lantus is still covered under insurance. May need to change to Levemir. UTD on eye exam, foot exam done today.  On ARB On Statin Discussed diet and exercise F/u in 4 months - POCT HgB A1C - insulin  glargine (LANTUS SOLOSTAR) 100 UNIT/ML Solostar Pen; ADMINISTER 30 UNITS UNDER THE SKIN DAILY  Dispense: 90 mL; Refill: 2  2. Bilateral chronic knee pain - DG Knee Complete 4 Views Left; Future - DG Knee Complete 4 Views Right; Future - Ambulatory referral to Physical Therapy  3. Essential hypertension Well controlled Continue current medications F/u in 4 months  4. Hyperlipidemia associated with type 2 diabetes mellitus ([place]) Well controlled Continue current medications F/u in 4 months  Return in about 4 months (around 11/29/2019) for follow up.      ITrinna Post, PA-C, have reviewed all documentation for this visit. The documentation on 07/30/19 for the exam, diagnosis, procedures, and orders are all accurate and complete.    Paulene Floor  [facility] [phone] (phone) [phone] (fax)  [place]

## 2019-07-30 ENCOUNTER — Other Ambulatory Visit: Payer: Self-pay

## 2019-07-30 ENCOUNTER — Encounter: Payer: Self-pay | Admitting: Physician Assistant

## 2019-07-30 ENCOUNTER — Ambulatory Visit (INDEPENDENT_AMBULATORY_CARE_PROVIDER_SITE_OTHER): Payer: Medicare Other | Admitting: Physician Assistant

## 2019-07-30 VITALS — BP 120/80 | HR 63 | Temp 96.9°F | Resp 16 | Wt 230.0 lb

## 2019-07-30 DIAGNOSIS — R809 Proteinuria, unspecified: Secondary | ICD-10-CM | POA: Diagnosis not present

## 2019-07-30 DIAGNOSIS — Z794 Long term (current) use of insulin: Secondary | ICD-10-CM

## 2019-07-30 DIAGNOSIS — E1129 Type 2 diabetes mellitus with other diabetic kidney complication: Secondary | ICD-10-CM

## 2019-07-30 DIAGNOSIS — I10 Essential (primary) hypertension: Secondary | ICD-10-CM | POA: Diagnosis not present

## 2019-07-30 DIAGNOSIS — M25562 Pain in left knee: Secondary | ICD-10-CM

## 2019-07-30 DIAGNOSIS — E1169 Type 2 diabetes mellitus with other specified complication: Secondary | ICD-10-CM

## 2019-07-30 DIAGNOSIS — M25561 Pain in right knee: Secondary | ICD-10-CM | POA: Diagnosis not present

## 2019-07-30 DIAGNOSIS — E785 Hyperlipidemia, unspecified: Secondary | ICD-10-CM

## 2019-07-30 DIAGNOSIS — G8929 Other chronic pain: Secondary | ICD-10-CM

## 2019-07-30 LAB — POCT GLYCOSYLATED HEMOGLOBIN (HGB A1C)
Est. average glucose Bld gHb Est-mCnc: 123
Hemoglobin A1C: 5.9 % — AB (ref 4.0–5.6)

## 2019-07-30 MED ORDER — LANTUS SOLOSTAR 100 UNIT/ML ~~LOC~~ SOPN: PEN_INJECTOR | SUBCUTANEOUS | 2 refills | Status: DC

## 2019-08-03 DIAGNOSIS — Z5181 Encounter for therapeutic drug level monitoring: Secondary | ICD-10-CM | POA: Diagnosis not present

## 2019-08-03 DIAGNOSIS — M25562 Pain in left knee: Secondary | ICD-10-CM | POA: Diagnosis not present

## 2019-08-03 DIAGNOSIS — M25552 Pain in left hip: Secondary | ICD-10-CM | POA: Diagnosis not present

## 2019-08-03 DIAGNOSIS — G8929 Other chronic pain: Secondary | ICD-10-CM | POA: Diagnosis not present

## 2019-08-03 DIAGNOSIS — M25561 Pain in right knee: Secondary | ICD-10-CM | POA: Diagnosis not present

## 2019-08-03 DIAGNOSIS — M5442 Lumbago with sciatica, left side: Secondary | ICD-10-CM | POA: Diagnosis not present

## 2019-08-03 DIAGNOSIS — Z79891 Long term (current) use of opiate analgesic: Secondary | ICD-10-CM | POA: Diagnosis not present

## 2019-08-15 ENCOUNTER — Other Ambulatory Visit: Payer: Self-pay | Admitting: Physician Assistant

## 2019-08-15 DIAGNOSIS — E785 Hyperlipidemia, unspecified: Secondary | ICD-10-CM

## 2019-09-04 ENCOUNTER — Ambulatory Visit (INDEPENDENT_AMBULATORY_CARE_PROVIDER_SITE_OTHER): Payer: Medicare Other | Admitting: Psychiatry

## 2019-09-04 ENCOUNTER — Encounter: Payer: Self-pay | Admitting: Psychiatry

## 2019-09-04 ENCOUNTER — Other Ambulatory Visit: Payer: Self-pay

## 2019-09-04 DIAGNOSIS — F329 Major depressive disorder, single episode, unspecified: Secondary | ICD-10-CM

## 2019-09-04 DIAGNOSIS — G4700 Insomnia, unspecified: Secondary | ICD-10-CM | POA: Diagnosis not present

## 2019-09-04 DIAGNOSIS — F429 Obsessive-compulsive disorder, unspecified: Secondary | ICD-10-CM

## 2019-09-04 MED ORDER — SERTRALINE HCL 100 MG PO TABS: 200.0000 mg | ORAL_TABLET | Freq: Every day | ORAL | 1 refills | Status: DC

## 2019-09-04 MED ORDER — MIRTAZAPINE 30 MG PO TABS: 30.0000 mg | ORAL_TABLET | Freq: Every day | ORAL | 1 refills | Status: DC
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
## 2019-09-08 ENCOUNTER — Other Ambulatory Visit: Payer: Self-pay

## 2019-09-08 ENCOUNTER — Ambulatory Visit: Payer: Medicare Other | Attending: Physician Assistant

## 2019-09-08 DIAGNOSIS — G8929 Other chronic pain: Secondary | ICD-10-CM | POA: Diagnosis not present

## 2019-09-08 DIAGNOSIS — M6281 Muscle weakness (generalized): Secondary | ICD-10-CM | POA: Insufficient documentation

## 2019-09-08 DIAGNOSIS — M25562 Pain in left knee: Secondary | ICD-10-CM | POA: Insufficient documentation

## 2019-09-08 DIAGNOSIS — R262 Difficulty in walking, not elsewhere classified: Secondary | ICD-10-CM | POA: Insufficient documentation

## 2019-09-08 DIAGNOSIS — M25561 Pain in right knee: Secondary | ICD-10-CM | POA: Insufficient documentation
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
## 2019-09-08 NOTE — Patient Instructions (Signed)
Pt and wife education on massage of L lateral hamstrings and tight skin L knee and thigh area. Pt and wife verbalized understanding.

## 2019-09-11 ENCOUNTER — Telehealth: Payer: Self-pay | Admitting: Psychiatry

## 2019-09-11 DIAGNOSIS — G4700 Insomnia, unspecified: Secondary | ICD-10-CM

## 2019-09-11 DIAGNOSIS — G2581 Restless legs syndrome: Secondary | ICD-10-CM

## 2019-09-11 NOTE — Telephone Encounter (Signed)
Left message for patient that fax is being sent to pain management specialist regarding possible increase in Gabapentin to improve restless legs and insomnia.  Also informed him that referral has been made to neurology for insomnia and restless legs.  Also recommended obtaining a ferritin level to rule out this is a possible cause for restless legs and that lab order was being sent to [facility].  Recommended contacting office if he has any questions or would prefer to use [facility] instead.

## 2019-09-14 ENCOUNTER — Telehealth: Payer: Self-pay | Admitting: Physician Assistant

## 2019-09-14 NOTE — Telephone Encounter (Signed)
Copied from CRM [id number]. Topic: Medicare AWV >> Sep 14, 2019  3:08 PM Claudette Laws R wrote: Reason for CRM:  Left message for patient to call back and schedule Medicare Annual Wellness Visit (AWV) either virtually or in office.  No hx of AWV eligible as of 09/20/2011  Please schedule at anytime with [facility] Health Advisor.

## 2019-09-15 ENCOUNTER — Ambulatory Visit: Payer: Medicare Other

## 2019-09-15 ENCOUNTER — Other Ambulatory Visit: Payer: Self-pay

## 2019-09-15 DIAGNOSIS — G8929 Other chronic pain: Secondary | ICD-10-CM | POA: Diagnosis not present

## 2019-09-15 DIAGNOSIS — M6281 Muscle weakness (generalized): Secondary | ICD-10-CM | POA: Diagnosis not present

## 2019-09-15 DIAGNOSIS — M25562 Pain in left knee: Secondary | ICD-10-CM | POA: Diagnosis not present

## 2019-09-15 DIAGNOSIS — M25561 Pain in right knee: Secondary | ICD-10-CM | POA: Diagnosis not present

## 2019-09-15 DIAGNOSIS — R262 Difficulty in walking, not elsewhere classified: Secondary | ICD-10-CM | POA: Diagnosis not present

## 2019-09-15 NOTE — Therapy (Signed)
[facility] [address], [place], [address] Phone: [phone]   Fax:  [phone]  Physical Therapy Treatment  Patient Details  Name: Alan George. MRN: [id number] Date of Birth: [birth year] Referring Provider (PT): Osvaldo Angst, [place]   Encounter Date: 09/15/2019   PT End of Session - 09/15/19 1101    Visit Number 2    Number of Visits 9    Date for PT Re-Evaluation 11/05/19    PT Start Time 0800    PT Stop Time 0845    PT Time Calculation (min) 45 min    Activity Tolerance Patient tolerated treatment well    Behavior During Therapy [facility] for tasks assessed/performed           Past Medical History:  Diagnosis Date  . Allergy   . Asthma   . Diabetes mellitus without complication (HCC)   . GERD (gastroesophageal reflux disease)   . Hypertension     Past Surgical History:  Procedure Laterality Date  . SPLENECTOMY    . TONSILLECTOMY    . TRACHEOSTOMY      There were no vitals filed for this visit.   Subjective Assessment - 09/15/19 1058    Subjective Pt reports B knee pain at level of 6/10 upon arrival for today's session.  Pt states he still has increased knee pain upon standing after prolonged sitting.  He also reports periods of feeling off balance and unsteady on his feet.    Limitations Sitting;Standing;Walking    Currently in Pain? Yes    Pain Score 6     Pain Location Knee    Pain Orientation Right;Left    Pain Descriptors / Indicators Stabbing;Tightness;Sore    Pain Type Chronic pain    Pain Onset More than a month ago    Pain Frequency Constant    Multiple Pain Sites No            Treatment:  Therapeutic Ex:  W-up on Nustep L3/2 x 5 min prior to therapeutic ex's; f/b standing at bar (all with 2# ankle wts B): heel raises 2x10; B hip abd 2x10; mini squats 2x10; HS curls 2x10; f/b seated LAQ 2x10 B; seated HS stretch 3x30 sec B; f/b supine SAQ with 2# on R LE and no wt on  L LE 2x10 B.  Standing quad stretch on 2nd step on gym stairs (3x30 sec B).  Manual Therapy:  Petrissage to L ITB, hamstrings and quads in sidelying on R side to increase tissue extensibility.                         PT Education - 09/15/19 1100    Education Details Educated pt in standing quad stretch on step, seated HS stretch, and ITB tennis ball massage for addition to HEP.    Person(s) Educated Patient    Methods Explanation;Demonstration;Tactile cues;Verbal cues    Comprehension Verbalized understanding;Returned demonstration            PT Short Term Goals - 09/08/19 1143      PT SHORT TERM GOAL #1   Title Patient will be independent with his HEP to decrease pain, improve strength and function.    Time 3    Period Weeks    Status New    Target Date 10/01/19             PT Long Term Goals - 09/08/19 1144  PT LONG TERM GOAL #1   Title Patient will have a decrease in B knee pain to 4/10 or less at worst to promote ability to stand up after prolonged sitting, as well as perform chores more comfortably.    Baseline 8/10 B knee pain at most for the past 3 months (09/08/2019)    Time 8    Period Weeks    Status New    Target Date 11/05/19      PT LONG TERM GOAL #2   Title Patient will improve bilateral hip strength by at least 1/2 MMT grade to promote ability to perform standing tasks with less difficulty.    Time 8    Period Weeks    Status New    Target Date 11/05/19      PT LONG TERM GOAL #3   Title Pt will improve knee FOTO score by at least 10 points as a demonstration of improved function.    Baseline Knee FOTO 47 (09/08/2019)    Time 8    Period Weeks    Status New    Target Date 11/05/19                 Plan - 09/15/19 1102    Clinical Impression Statement Pt tolerated B hip and knee strengthening and stretching without problems today.  Pt had more difficulty with L LE ex's vs. R.  Continue with working on flexibility and  strength at next visit as tolerated.    Personal Factors and Comorbidities Comorbidity 3+;Age;Fitness;Time since onset of injury/illness/exacerbation;Past/Current Experience    Comorbidities HTN, DM, depression, insomnia, low back and L hip pain    Examination-Activity Limitations Sit;Transfers;Sleep;Lift;Squat;Locomotion Level;Stairs    Stability/Clinical Decision Making Evolving/Moderate complexity    Clinical Decision Making Moderate    Rehab Potential Fair    PT Frequency 1x / week    PT Duration 8 weeks    PT Treatment/Interventions Functional mobility training;Therapeutic activities;Therapeutic exercise;Balance training;Neuromuscular re-education;Patient/family education;Manual techniques;Dry needling;Spinal Manipulations;Joint Manipulations;Aquatic Therapy;Electrical Stimulation;Iontophoresis 4mg /ml Dexamethasone;Gait training;Stair training    PT Next Visit Plan glute med, max, medial hamstrings, trunk, scapular strengthening, lumbo pelvic, pelvic femoral control, manual techniques, modalities PRN    Consulted and Agree with Plan of Care Patient           Patient will benefit from skilled therapeutic intervention in order to improve the following deficits and impairments:  Pain, Postural dysfunction, Improper body mechanics, Difficulty walking, Decreased strength, Decreased balance, Abnormal gait  Visit Diagnosis: Chronic pain of left knee  Chronic pain of right knee  Muscle weakness (generalized)     Problem List Patient Active Problem List   Diagnosis Date Noted  . MDD (major depressive disorder) 12/20/2017  . OCD (obsessive compulsive disorder) 12/20/2017  . GERD (gastroesophageal reflux disease) 04/19/2015  . Leukocytosis 01/19/2015  . Elevated liver enzymes 01/19/2015  . Abnormal LFTs 01/19/2015  . Pain 01/19/2015  . Proteinuria 10/22/2014  . Hypertension 10/19/2014  . Type 2 diabetes mellitus with microalbuminuria, with long-term current use of insulin (HCC)  10/19/2014  . Hyperlipidemia associated with type 2 diabetes mellitus (HCC) 10/19/2014  . Absent peripheral pulse 10/19/2014  . Airway hyperreactivity 10/19/2014  . Back ache 10/19/2014  . Acid reflux 10/19/2014    Kattie Santoyo, MPT 09/15/2019, 11:05 AM   [facility] [address], [place] Phone: [phone]   Fax:  [phone]  Name: Alan George. MRN: Anastasia Pall Date of Birth: 04/11/1955

## 2019-09-24 ENCOUNTER — Ambulatory Visit: Payer: Medicare Other

## 2019-09-25 ENCOUNTER — Telehealth: Payer: Self-pay | Admitting: Psychiatry

## 2019-09-25 DIAGNOSIS — G2581 Restless legs syndrome: Secondary | ICD-10-CM

## 2019-09-25 MED ORDER — GABAPENTIN 300 MG PO CAPS: ORAL_CAPSULE | ORAL | 0 refills | Status: DC

## 2019-09-25 NOTE — Telephone Encounter (Signed)
Contacted patient to discuss plan to increase gabapentin.  Discussed plan with patient's wife who is involved with care and signed information release on file.  Discussed that this provider had been communicating with Jeani Sow, NP regarding possible increase in gabapentin to improve restless legs. Discussed that increasing gabapentin may be helpful for his RLS, insomnia, and pain.  Will send in prescription for 300 mg midday and 600 mg 2 hours before bedtime.  Discussed that taking gabapentin about 2 hours before bedtime may help prevent RLS when he attempts to fall asleep.  Discussed possible side effects with increasing gabapentin and encouraged wife to contact office if he has any side effects or questions.  Wife reports that patient has appointment to see sleep specialist.

## 2019-09-29 ENCOUNTER — Ambulatory Visit: Payer: Medicare Other | Attending: Physician Assistant

## 2019-09-29 ENCOUNTER — Other Ambulatory Visit: Payer: Self-pay

## 2019-09-29 DIAGNOSIS — M6281 Muscle weakness (generalized): Secondary | ICD-10-CM | POA: Diagnosis not present

## 2019-09-29 DIAGNOSIS — R262 Difficulty in walking, not elsewhere classified: Secondary | ICD-10-CM | POA: Diagnosis not present

## 2019-09-29 DIAGNOSIS — M25562 Pain in left knee: Secondary | ICD-10-CM | POA: Diagnosis not present

## 2019-09-29 DIAGNOSIS — G8929 Other chronic pain: Secondary | ICD-10-CM | POA: Diagnosis not present

## 2019-09-29 DIAGNOSIS — M25561 Pain in right knee: Secondary | ICD-10-CM | POA: Diagnosis not present
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
## 2019-10-06 ENCOUNTER — Other Ambulatory Visit: Payer: Self-pay

## 2019-10-06 ENCOUNTER — Ambulatory Visit: Payer: Medicare Other

## 2019-10-06 DIAGNOSIS — G8929 Other chronic pain: Secondary | ICD-10-CM

## 2019-10-06 DIAGNOSIS — M25562 Pain in left knee: Secondary | ICD-10-CM

## 2019-10-06 DIAGNOSIS — R262 Difficulty in walking, not elsewhere classified: Secondary | ICD-10-CM

## 2019-10-06 DIAGNOSIS — M25561 Pain in right knee: Secondary | ICD-10-CM

## 2019-10-06 DIAGNOSIS — M6281 Muscle weakness (generalized): Secondary | ICD-10-CM | POA: Diagnosis not present

## 2019-10-06 NOTE — Patient Instructions (Addendum)
  Access Code: [email or web address] URL: [email or web address] Date: 10/06/2019 Prepared by: Loralyn Freshwater  Exercises Straight Leg Raise with External Rotation - 2 x daily - 7 x weekly - 3 sets - 5 reps Seated Hip Adduction Isometrics with Ball - 1 x daily - 7 x weekly - 3 sets - 10 reps - 5 seconds hold    Supine R hip IR stretch 1 minute x 4  Reviewed and given as part of his HEP. Pt demonstrated and verbalized understanding. Handout provided.
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
## 2019-10-20 ENCOUNTER — Telehealth: Payer: Self-pay | Admitting: Physician Assistant

## 2019-10-20 NOTE — Telephone Encounter (Signed)
Copied from CRM [id number]. Topic: Medicare AWV >> Oct 20, 2019  1:17 PM Claudette Laws R wrote: Reason for CRM:  Left message for patient to call back and schedule Medicare Annual Wellness Visit (AWV) either virtually or in office.  No hx of AWV eligible as of 8/01/20163  Please schedule at anytime with [facility] Health Advisor.

## 2019-10-21 ENCOUNTER — Ambulatory Visit: Payer: Medicare Other | Attending: Physician Assistant

## 2019-10-21 ENCOUNTER — Other Ambulatory Visit: Payer: Self-pay

## 2019-10-21 DIAGNOSIS — M6281 Muscle weakness (generalized): Secondary | ICD-10-CM | POA: Insufficient documentation

## 2019-10-21 DIAGNOSIS — R262 Difficulty in walking, not elsewhere classified: Secondary | ICD-10-CM | POA: Diagnosis not present

## 2019-10-21 DIAGNOSIS — M25562 Pain in left knee: Secondary | ICD-10-CM | POA: Insufficient documentation

## 2019-10-21 DIAGNOSIS — M25561 Pain in right knee: Secondary | ICD-10-CM | POA: Diagnosis not present

## 2019-10-21 DIAGNOSIS — G8929 Other chronic pain: Secondary | ICD-10-CM | POA: Insufficient documentation
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
## 2019-10-22 ENCOUNTER — Other Ambulatory Visit: Payer: Self-pay | Admitting: Physician Assistant

## 2019-10-22 ENCOUNTER — Institutional Professional Consult (permissible substitution): Payer: Self-pay | Admitting: Neurology

## 2019-10-22 DIAGNOSIS — E1129 Type 2 diabetes mellitus with other diabetic kidney complication: Secondary | ICD-10-CM

## 2019-10-22 DIAGNOSIS — I10 Essential (primary) hypertension: Secondary | ICD-10-CM

## 2019-10-28 ENCOUNTER — Ambulatory Visit
Admission: RE | Admit: 2019-10-28 | Discharge: 2019-10-28 | Disposition: A | Discharge status: Home / Self Care | Payer: Medicare Other | Attending: Physician Assistant | Admitting: Physician Assistant

## 2019-10-28 ENCOUNTER — Ambulatory Visit
Admission: RE | Admit: 2019-10-28 | Discharge: 2019-10-28 | Disposition: A | Discharge status: Home / Self Care | Payer: Medicare Other | Source: Ambulatory Visit | Attending: Physician Assistant | Admitting: Physician Assistant

## 2019-10-28 ENCOUNTER — Ambulatory Visit: Payer: Medicare Other

## 2019-10-28 ENCOUNTER — Other Ambulatory Visit: Payer: Self-pay

## 2019-10-28 DIAGNOSIS — M25562 Pain in left knee: Secondary | ICD-10-CM | POA: Diagnosis not present

## 2019-10-28 DIAGNOSIS — G8929 Other chronic pain: Secondary | ICD-10-CM

## 2019-10-28 DIAGNOSIS — M25561 Pain in right knee: Secondary | ICD-10-CM | POA: Insufficient documentation

## 2019-10-28 DIAGNOSIS — R262 Difficulty in walking, not elsewhere classified: Secondary | ICD-10-CM | POA: Diagnosis not present

## 2019-10-28 DIAGNOSIS — M1712 Unilateral primary osteoarthritis, left knee: Secondary | ICD-10-CM | POA: Diagnosis not present

## 2019-10-28 DIAGNOSIS — M6281 Muscle weakness (generalized): Secondary | ICD-10-CM | POA: Diagnosis not present

## 2019-10-28 DIAGNOSIS — M1711 Unilateral primary osteoarthritis, right knee: Secondary | ICD-10-CM | POA: Diagnosis not present
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
## 2019-11-03 DIAGNOSIS — M25552 Pain in left hip: Secondary | ICD-10-CM | POA: Diagnosis not present

## 2019-11-03 DIAGNOSIS — G8929 Other chronic pain: Secondary | ICD-10-CM | POA: Diagnosis not present

## 2019-11-03 DIAGNOSIS — M25561 Pain in right knee: Secondary | ICD-10-CM | POA: Diagnosis not present

## 2019-11-03 DIAGNOSIS — Z5181 Encounter for therapeutic drug level monitoring: Secondary | ICD-10-CM | POA: Diagnosis not present

## 2019-11-03 DIAGNOSIS — M5442 Lumbago with sciatica, left side: Secondary | ICD-10-CM | POA: Diagnosis not present

## 2019-11-03 DIAGNOSIS — M25562 Pain in left knee: Secondary | ICD-10-CM | POA: Diagnosis not present

## 2019-11-03 DIAGNOSIS — M1610 Unilateral primary osteoarthritis, unspecified hip: Secondary | ICD-10-CM | POA: Diagnosis not present

## 2019-11-03 DIAGNOSIS — Z79891 Long term (current) use of opiate analgesic: Secondary | ICD-10-CM | POA: Diagnosis not present

## 2019-11-04 ENCOUNTER — Ambulatory Visit: Payer: Medicare Other

## 2019-11-11 ENCOUNTER — Ambulatory Visit: Payer: Medicare Other

## 2019-11-11 ENCOUNTER — Other Ambulatory Visit: Payer: Self-pay

## 2019-11-11 DIAGNOSIS — M6281 Muscle weakness (generalized): Secondary | ICD-10-CM | POA: Diagnosis not present

## 2019-11-11 DIAGNOSIS — M25562 Pain in left knee: Secondary | ICD-10-CM | POA: Diagnosis not present

## 2019-11-11 DIAGNOSIS — R262 Difficulty in walking, not elsewhere classified: Secondary | ICD-10-CM

## 2019-11-11 DIAGNOSIS — M25561 Pain in right knee: Secondary | ICD-10-CM | POA: Diagnosis not present

## 2019-11-11 DIAGNOSIS — G8929 Other chronic pain: Secondary | ICD-10-CM | POA: Diagnosis not present

## 2019-11-11 NOTE — Patient Instructions (Signed)
Access Code: [email or web address] URL: [email or web address] Date: 11/11/2019 Prepared by: Loralyn Freshwater  Exercises Straight Leg Raise with External Rotation - 2 x daily - 7 x weekly - 3 sets - 5 reps Seated Hip Adduction Isometrics with Ball - 1 x daily - 7 x weekly - 3 sets - 10 reps - 5 seconds hold Sitting Knee Extension with Resistance - 1 x daily - 7 x weekly - 3 sets - 10 reps Forward Step Up with Counter Support - 1 x daily - 7 x weekly - 1-3 sets - 10 reps Standing Hip Abduction with Counter Support - 1 x daily - 7 x weekly - 1-3 sets - 10 reps - 5 seconds hold
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
## 2019-11-12 ENCOUNTER — Other Ambulatory Visit: Payer: Self-pay | Admitting: Physician Assistant

## 2019-11-12 DIAGNOSIS — K219 Gastro-esophageal reflux disease without esophagitis: Secondary | ICD-10-CM

## 2019-11-12 NOTE — Telephone Encounter (Signed)
Requested Prescriptions  Pending Prescriptions Disp Refills   omeprazole (PRILOSEC) 20 MG capsule [Pharmacy Med Name: OMEPRAZOLE 20MG  CAPSULES] 90 capsule 1    Sig: TAKE 1 CAPSULE(20 MG) BY MOUTH DAILY     Gastroenterology: Proton Pump Inhibitors Passed - 11/12/2019  5:12 PM      Passed - Valid encounter within last 12 months    Recent Outpatient Visits          3 months ago Type 2 diabetes mellitus with microalbuminuria, with long-term current use of insulin [facility])   [facility] [place], Adriana M, PA-C   7 months ago Type 2 diabetes mellitus with microalbuminuria, with long-term current use of insulin [facility])   [facility] [place], Adriana M, PA-C   10 months ago Type 2 diabetes mellitus with microalbuminuria, with long-term current use of insulin [facility])   [facility] [place], [place], [place]   1 year ago Type 2 diabetes mellitus with microalbuminuria, with long-term current use of insulin [facility])   [facility] [place], [place], [place]   1 year ago Type 2 diabetes mellitus with microalbuminuria, with long-term current use of insulin [facility])   [facility] [place], [place], [place]      Future Appointments            In 3 weeks [place], PA-C Trey Sailors, [facility]

## 2019-11-18 ENCOUNTER — Ambulatory Visit: Payer: Medicare Other

## 2019-11-20 ENCOUNTER — Other Ambulatory Visit: Payer: Self-pay | Admitting: Physician Assistant

## 2019-11-20 DIAGNOSIS — E1129 Type 2 diabetes mellitus with other diabetic kidney complication: Secondary | ICD-10-CM

## 2019-11-22 ENCOUNTER — Encounter: Payer: Self-pay | Admitting: Physician Assistant

## 2019-12-01 ENCOUNTER — Ambulatory Visit: Payer: Self-pay | Admitting: Physician Assistant

## 2019-12-03 ENCOUNTER — Other Ambulatory Visit: Payer: Self-pay | Admitting: Physician Assistant

## 2019-12-03 ENCOUNTER — Encounter: Payer: Self-pay | Admitting: Physician Assistant

## 2019-12-03 ENCOUNTER — Ambulatory Visit (INDEPENDENT_AMBULATORY_CARE_PROVIDER_SITE_OTHER): Payer: Medicare Other | Admitting: Physician Assistant

## 2019-12-03 ENCOUNTER — Other Ambulatory Visit: Payer: Self-pay

## 2019-12-03 VITALS — BP 132/69 | HR 73 | Temp 98.0°F | Resp 16 | Ht 72.0 in | Wt 240.6 lb

## 2019-12-03 DIAGNOSIS — I10 Essential (primary) hypertension: Secondary | ICD-10-CM

## 2019-12-03 DIAGNOSIS — Z0000 Encounter for general adult medical examination without abnormal findings: Secondary | ICD-10-CM | POA: Diagnosis not present

## 2019-12-03 DIAGNOSIS — Z125 Encounter for screening for malignant neoplasm of prostate: Secondary | ICD-10-CM | POA: Diagnosis not present

## 2019-12-03 DIAGNOSIS — Z2821 Immunization not carried out because of patient refusal: Secondary | ICD-10-CM | POA: Diagnosis not present

## 2019-12-03 DIAGNOSIS — Z794 Long term (current) use of insulin: Secondary | ICD-10-CM

## 2019-12-03 DIAGNOSIS — E1129 Type 2 diabetes mellitus with other diabetic kidney complication: Secondary | ICD-10-CM

## 2019-12-03 DIAGNOSIS — D72829 Elevated white blood cell count, unspecified: Secondary | ICD-10-CM

## 2019-12-03 DIAGNOSIS — R809 Proteinuria, unspecified: Secondary | ICD-10-CM | POA: Diagnosis not present

## 2019-12-03 DIAGNOSIS — E785 Hyperlipidemia, unspecified: Secondary | ICD-10-CM

## 2019-12-03 DIAGNOSIS — E1169 Type 2 diabetes mellitus with other specified complication: Secondary | ICD-10-CM
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
## 2019-12-04 LAB — CBC WITH DIFFERENTIAL/PLATELET
Basophils Absolute: 0.1 10*3/uL (ref 0.0–0.2)
Basos: 0 %
EOS (ABSOLUTE): 0.1 10*3/uL (ref 0.0–0.4)
Eos: 1 %
Hematocrit: 49 % (ref 37.5–51.0)
Hemoglobin: 16.9 g/dL (ref 13.0–17.7)
Immature Grans (Abs): 0 10*3/uL (ref 0.0–0.1)
Immature Granulocytes: 0 %
Lymphocytes Absolute: 6.3 10*3/uL — ABNORMAL HIGH (ref 0.7–3.1)
Lymphs: 38 %
MCH: 31.4 pg (ref 26.6–33.0)
MCHC: 34.5 g/dL (ref 31.5–35.7)
MCV: 91 fL (ref 79–97)
Monocytes Absolute: 1.2 10*3/uL — ABNORMAL HIGH (ref 0.1–0.9)
Monocytes: 7 %
Neutrophils Absolute: 8.8 10*3/uL — ABNORMAL HIGH (ref 1.4–7.0)
Neutrophils: 54 %
Platelets: 436 10*3/uL (ref 150–450)
RBC: 5.39 x10E6/uL (ref 4.14–5.80)
RDW: 13.2 % (ref 11.6–15.4)
WBC: 16.5 10*3/uL — ABNORMAL HIGH (ref 3.4–10.8)

## 2019-12-04 LAB — COMPREHENSIVE METABOLIC PANEL
ALT: 18 IU/L (ref 0–44)
AST: 20 IU/L (ref 0–40)
Albumin/Globulin Ratio: 1.4 (ref 1.2–2.2)
Albumin: 4.8 g/dL (ref 3.8–4.8)
Alkaline Phosphatase: 69 IU/L (ref 44–121)
BUN/Creatinine Ratio: 22 (ref 10–24)
BUN: 22 mg/dL (ref 8–27)
Bilirubin Total: 0.4 mg/dL (ref 0.0–1.2)
CO2: 20 mmol/L (ref 20–29)
Calcium: 10.2 mg/dL (ref 8.6–10.2)
Chloride: 97 mmol/L (ref 96–106)
Creatinine, Ser: 1.02 mg/dL (ref 0.76–1.27)
GFR calc Af Amer: 89 mL/min/{1.73_m2} (ref >59)
GFR calc non Af Amer: 77 mL/min/{1.73_m2} (ref >59)
Globulin, Total: 3.4 g/dL (ref 1.5–4.5)
Glucose: 85 mg/dL (ref 65–99)
Potassium: 4.4 mmol/L (ref 3.5–5.2)
Sodium: 136 mmol/L (ref 134–144)
Total Protein: 8.2 g/dL (ref 6.0–8.5)

## 2019-12-04 LAB — LIPID PANEL
Chol/HDL Ratio: 2.4 ratio (ref 0.0–5.0)
Cholesterol, Total: 104 mg/dL (ref 100–199)
HDL: 43 mg/dL (ref >39)
LDL Chol Calc (NIH): 43 mg/dL (ref 0–99)
Triglycerides: 90 mg/dL (ref 0–149)
VLDL Cholesterol Cal: 18 mg/dL (ref 5–40)

## 2019-12-04 LAB — HEMOGLOBIN A1C
Est. average glucose Bld gHb Est-mCnc: 169 mg/dL
Hgb A1c MFr Bld: 7.5 % — ABNORMAL HIGH (ref 4.8–5.6)

## 2019-12-04 LAB — PSA: Prostate Specific Ag, Serum: 2.3 ng/mL (ref 0.0–4.0)

## 2019-12-04 LAB — TSH: TSH: 6.26 u[IU]/mL — ABNORMAL HIGH (ref 0.450–4.500)

## 2019-12-04 NOTE — Progress Notes (Signed)
Referral for hematology placed 

## 2019-12-04 NOTE — Addendum Note (Signed)
Addended by: Trey Sailors on: 12/04/2019 12:22 PM   Modules accepted: Orders

## 2019-12-08 LAB — T3: T3, Total: 105 ng/dL (ref 71–180)

## 2019-12-08 LAB — T4: T4, Total: 6.2 ug/dL (ref 4.5–12.0)

## 2019-12-08 LAB — SPECIMEN STATUS REPORT

## 2020-01-12 DIAGNOSIS — M5442 Lumbago with sciatica, left side: Secondary | ICD-10-CM | POA: Diagnosis not present

## 2020-01-12 DIAGNOSIS — G8929 Other chronic pain: Secondary | ICD-10-CM | POA: Diagnosis not present

## 2020-01-12 DIAGNOSIS — F1190 Opioid use, unspecified, uncomplicated: Secondary | ICD-10-CM | POA: Diagnosis not present

## 2020-01-12 DIAGNOSIS — M1610 Unilateral primary osteoarthritis, unspecified hip: Secondary | ICD-10-CM | POA: Diagnosis not present

## 2020-01-12 DIAGNOSIS — Z5181 Encounter for therapeutic drug level monitoring: Secondary | ICD-10-CM | POA: Diagnosis not present

## 2020-01-12 DIAGNOSIS — G894 Chronic pain syndrome: Secondary | ICD-10-CM | POA: Diagnosis not present

## 2020-01-12 DIAGNOSIS — M25562 Pain in left knee: Secondary | ICD-10-CM | POA: Diagnosis not present

## 2020-01-12 DIAGNOSIS — Z79891 Long term (current) use of opiate analgesic: Secondary | ICD-10-CM | POA: Diagnosis not present

## 2020-01-12 DIAGNOSIS — M25561 Pain in right knee: Secondary | ICD-10-CM | POA: Diagnosis not present

## 2020-01-21 ENCOUNTER — Other Ambulatory Visit: Payer: Self-pay | Admitting: Physician Assistant

## 2020-01-21 DIAGNOSIS — E1129 Type 2 diabetes mellitus with other diabetic kidney complication: Secondary | ICD-10-CM

## 2020-01-21 DIAGNOSIS — I10 Essential (primary) hypertension: Secondary | ICD-10-CM

## 2020-01-21 DIAGNOSIS — Z794 Long term (current) use of insulin: Secondary | ICD-10-CM

## 2020-02-14 ENCOUNTER — Other Ambulatory Visit: Payer: Self-pay | Admitting: Physician Assistant

## 2020-02-14 DIAGNOSIS — E1129 Type 2 diabetes mellitus with other diabetic kidney complication: Secondary | ICD-10-CM

## 2020-02-14 NOTE — Telephone Encounter (Signed)
Requested Prescriptions  Pending Prescriptions Disp Refills  . metFORMIN (GLUCOPHAGE) 1000 MG tablet [Pharmacy Med Name: METFORMIN 1000MG TABLETS] 180 tablet 3    Sig: TAKE 1 TABLET(1000 MG) BY MOUTH TWICE DAILY WITH A MEAL     Endocrinology:  Diabetes - Biguanides Passed - 02/14/2020  6:36 AM      Passed - Cr in normal range and within 360 days    Creat  Date Value Ref Range Status  07/03/2016 1.02 0.70 - 1.25 mg/dL Final    Comment:      For patients > or = 64 years of age: The upper reference limit for Creatinine is approximately 13% higher for people identified as African-American.      Creatinine, Ser  Date Value Ref Range Status  12/03/2019 1.02 0.76 - 1.27 mg/dL Final   Creatinine, Urine  Date Value Ref Range Status  12/19/2016 207 20 - 320 mg/dL Final         Passed - HBA1C is between 0 and 7.9 and within 180 days    Hgb A1c MFr Bld  Date Value Ref Range Status  12/03/2019 7.5 (H) 4.8 - 5.6 % Final    Comment:             Prediabetes: 5.7 - 6.4          Diabetes: >6.4          Glycemic control for adults with diabetes: <7.0          Passed - eGFR in normal range and within 360 days    GFR, Est African American  Date Value Ref Range Status  07/03/2016 >89 >=60 mL/min Final   GFR calc Af Amer  Date Value Ref Range Status  12/03/2019 89 >59 mL/min/1.73 Final    Comment:    **In accordance with recommendations from the NKF-ASN Task force,**   [facility] is in the process of updating its eGFR calculation to the   2021 CKD-EPI creatinine equation that estimates kidney function   without a race variable.    GFR, Est Non African American  Date Value Ref Range Status  07/03/2016 80 >=60 mL/min Final   GFR calc non Af Amer  Date Value Ref Range Status  12/03/2019 77 >59 mL/min/1.73 Final         Passed - Valid encounter within last 6 months    Recent Outpatient Visits          2 months ago Annual physical exam   [facility] Carles Collet  M, PA-C   6 months ago Type 2 diabetes mellitus with microalbuminuria, with long-term current use of insulin [facility])   [place], [place], PA-C   10 months ago Type 2 diabetes mellitus with microalbuminuria, with long-term current use of insulin [facility])   [facility] [place], [place], [place]   1 year ago Type 2 diabetes mellitus with microalbuminuria, with long-term current use of insulin [facility])   [facility] [place], [place], [place]   1 year ago Type 2 diabetes mellitus with microalbuminuria, with long-term current use of insulin [facility])   [facility] Trinna Post, [place]      Future Appointments            In 1 month Terrilee Croak, Wendee Beavers, PA-C [employer]

## 2020-02-22 ENCOUNTER — Inpatient Hospital Stay: Payer: Medicare Other | Admitting: Oncology

## 2020-02-22 ENCOUNTER — Inpatient Hospital Stay: Payer: Medicare Other

## 2020-03-04 ENCOUNTER — Telehealth: Payer: Self-pay | Admitting: Physician Assistant

## 2020-03-04 NOTE — Telephone Encounter (Signed)
Copied from CRM [id number]. Topic: Medicare AWV >> Mar 04, 2020  3:58 PM Claudette Laws R wrote: Reason for CRM:  Left message for patient to call back and schedule Medicare Annual Wellness Visit (AWV) in office.   If not able to come in office, please offer to do virtually.   No history of AWV   Please schedule at anytime with [facility] Health Advisor.  If any questions, please contact me at [phone]

## 2020-03-07 ENCOUNTER — Ambulatory Visit: Payer: Medicare Other | Admitting: Psychiatry

## 2020-03-08 ENCOUNTER — Inpatient Hospital Stay: Payer: Medicare Other | Admitting: Oncology

## 2020-03-08 ENCOUNTER — Inpatient Hospital Stay: Payer: Medicare Other

## 2020-03-09 ENCOUNTER — Other Ambulatory Visit: Payer: Self-pay | Admitting: Physician Assistant

## 2020-03-09 DIAGNOSIS — E1129 Type 2 diabetes mellitus with other diabetic kidney complication: Secondary | ICD-10-CM

## 2020-04-08 ENCOUNTER — Ambulatory Visit: Payer: Self-pay | Admitting: Physician Assistant

## 2020-04-20 ENCOUNTER — Other Ambulatory Visit: Payer: Self-pay

## 2020-04-20 ENCOUNTER — Ambulatory Visit (INDEPENDENT_AMBULATORY_CARE_PROVIDER_SITE_OTHER): Payer: Medicare Other | Admitting: Psychiatry

## 2020-04-20 ENCOUNTER — Encounter: Payer: Self-pay | Admitting: Psychiatry

## 2020-04-20 DIAGNOSIS — F429 Obsessive-compulsive disorder, unspecified: Secondary | ICD-10-CM | POA: Diagnosis not present

## 2020-04-20 DIAGNOSIS — G2581 Restless legs syndrome: Secondary | ICD-10-CM

## 2020-04-20 DIAGNOSIS — F329 Major depressive disorder, single episode, unspecified: Secondary | ICD-10-CM | POA: Diagnosis not present

## 2020-04-20 DIAGNOSIS — G4700 Insomnia, unspecified: Secondary | ICD-10-CM | POA: Diagnosis not present

## 2020-04-20 MED ORDER — MIRTAZAPINE 30 MG PO TABS: 30.0000 mg | ORAL_TABLET | Freq: Every day | ORAL | 1 refills | Status: AC

## 2020-04-20 MED ORDER — SERTRALINE HCL 100 MG PO TABS: 200.0000 mg | ORAL_TABLET | Freq: Every day | ORAL | 1 refills | Status: DC

## 2020-04-20 MED ORDER — GABAPENTIN 300 MG PO CAPS: ORAL_CAPSULE | ORAL | 1 refills | Status: AC

## 2020-04-20 NOTE — Progress Notes (Signed)
Alan George [id number] 1955-02-25 65 y.o.  Subjective:   Patient ID:  Alan George. is a 65 y.o. (DOB January 02, 1956) male.  Chief Complaint:  Chief Complaint  Patient presents with  . Follow-up    H/o depression and anxiety    HPI Alan George. presents to the office today for follow-up of depression and anxiety. He is accompanied by wife. He reports that they are about to move since house is being sold. They plan to find another home in the same area. Wife reports that he has had some anxiety with thinking about the move. He denies obsessive thoughts or rumination about move. He reports that he is physically not able to move his belongings like he has in the past. Denies any physical s/s with anxiety. He reports that his mood is "pretty good." Denies depressed mood. He reports that sleep has been "not bad." He and his wife reports that his sleep has improved and he is getting about 6-6.5 hours on average instead of 4 hours. Occasional nights with 8 hours of sleep. He reports that he has been following diabetic diet and this has helped with weight loss. He reports that he is not eating sweets like he used to. He reports that his appetite "kind of calmed down." Energy and motivation have been good. Denies SI.   He reports that he will occasionally forget what he was about to say. He reports that he cannot remember certain things that he used to be able to recall, such as names of movies and actors. He reports that memory difficulties are infrequent and occur a couple times a week. Wife reports that he is easily distracted and he agrees with this.   He reports that he seldom leaves home. He reports that he only goes to medical apts and to visit family.   Denies any ETOH use in 15-16 years.   He reports that Gabapentin has been helpful for his sleep, RLS, and possibly anxiety.    [place] [facility] Visit from 12/03/2019 in [place] Visit from  04/14/2019 in [place] Visit from 08/28/2017 in [place] Visit from 05/14/2017 in [place] Visit from 12/19/2016 in [facility]  PHQ-2 Total Score 0 0 0 2 0  PHQ-9 Total Score 3 5 2 7  --       Review of Systems:  Review of Systems  Endocrine:       Improved glycemic control  Musculoskeletal: Negative for gait problem.  Neurological: Positive for tremors.       Improved RLS  Psychiatric/Behavioral:       Please refer to HPI    He reports that he has had 2 falls related to trying to move in cramped areas and tripping in wires. He reports occasional dizziness if he turns around quickly. He reports that RLS has been ok.   Medications: I have reviewed the patient's current medications.  Current Outpatient Medications  Medication Sig Dispense Refill  . albuterol (PROAIR HFA) 108 (90 Base) MCG/ACT inhaler Inhale 2 puffs into the lungs every 4 (four) hours as needed for wheezing or shortness of breath. 1 Inhaler 5  . atorvastatin (LIPITOR) 80 MG tablet TAKE 1 TABLET(80 MG) BY MOUTH DAILY 90 tablet 2  . cyclobenzaprine (FLEXERIL) 10 MG tablet Take by mouth.    . fluticasone furoate-vilanterol (BREO ELLIPTA) 100-25 MCG/INH AEPB Inhale 1 puff into the lungs daily.    [name]  HYDROmorphone (DILAUDID) 4 MG tablet take 0.5 tablet by mouth every 4 hours if needed  0  . insulin glargine (LANTUS SOLOSTAR) 100 UNIT/ML Solostar Pen ADMINISTER 30 UNITS UNDER THE SKIN DAILY 90 mL 2  . losartan (COZAAR) 50 MG tablet TAKE 1 TABLET(50 MG) BY MOUTH DAILY 90 tablet 0  . Melatonin 1 MG TABS Take by mouth.    . metFORMIN (GLUCOPHAGE) 1000 MG tablet TAKE 1 TABLET(1000 MG) BY MOUTH TWICE DAILY WITH A MEAL 180 tablet 3  . omeprazole (PRILOSEC) 20 MG capsule TAKE 1 CAPSULE(20 MG) BY MOUTH DAILY 90 capsule 1  . gabapentin (NEURONTIN) 300 MG capsule TAKE 1 CAPSULE MID-DAY AND 2 CAPSULES BEFORE BEDTIME 270 capsule 1  . glucose blood  (CONTOUR NEXT TEST) test strip CHECK BLOOD SUGAR DAILY 100 strip 8  . mirtazapine (REMERON) 30 MG tablet Take 1 tablet (30 mg total) by mouth at bedtime. 90 tablet 1  . NARCAN 4 MG/0.1ML LIQD nasal spray kit     . sertraline (ZOLOFT) 100 MG tablet Take 2 tablets (200 mg total) by mouth daily. 180 tablet 1   No current facility-administered medications for this visit.    Medication Side Effects: None  Allergies:  Allergies  Allergen Reactions  . Dog Epithelium   . Dust Mite Extract   . Lisinopril Cough  . Pollen Extract   . Tree Extract   . Iodinated Diagnostic Agents Hives    ?gadolinium-thinks it was after MRI  . Other Rash    Past Medical History:  Diagnosis Date  . Allergy   . Asthma   . Diabetes mellitus without complication ([place])   . GERD (gastroesophageal reflux disease)   . Hypertension     Family History  Problem Relation Age of Onset  . Bipolar disorder Mother   . Cancer Mother        BREAST  . Diabetes Father   . Bipolar disorder Brother   . Cancer Paternal Grandfather        LUNG    Social History   Socioeconomic History  . Marital status: Married    Spouse name: Not on file  . Number of children: Not on file  . Years of education: Not on file  . Highest education level: Not on file  Occupational History  . Not on file  Tobacco Use  . Smoking status: Former Research scientist (life sciences)  . Smokeless tobacco: Never Used  Vaping Use  . Vaping Use: Never used  Substance and Sexual Activity  . Alcohol use: No    Alcohol/week: 0.0 standard drinks  . Drug use: No  . Sexual activity: Not on file  Other Topics Concern  . Not on file  Social History Narrative  . Not on file   Social Determinants of Health   Financial Resource Strain: Not on file  Food Insecurity: Not on file  Transportation Needs: Not on file  Physical Activity: Not on file  Stress: Not on file  Social Connections: Not on file  Intimate Partner Violence: Not on file    Past Medical History,  Surgical history, Social history, and Family history were reviewed and updated as appropriate.   Please see review of systems for further details on the patient's review from today.   Objective:   Physical Exam:  Wt 231 lb (104.8 kg)   BMI 31.33 kg/m   Physical Exam Constitutional:      General: He is not in acute distress. Musculoskeletal:        General:  No deformity.  Neurological:     Mental Status: He is alert and oriented to person, place, and time.     Coordination: Coordination normal.  Psychiatric:        Attention and Perception: Attention and perception normal. He does not perceive auditory or visual hallucinations.        Mood and Affect: Mood normal. Mood is not anxious or depressed. Affect is not labile, blunt, angry or inappropriate.        Speech: Speech normal.        Behavior: Behavior normal.        Thought Content: Thought content normal. Thought content is not paranoid or delusional. Thought content does not include homicidal or suicidal ideation. Thought content does not include homicidal or suicidal plan.        Cognition and Memory: Cognition and memory normal.        Judgment: Judgment normal.     Comments: Insight intact     Lab Review:     Component Value Date/Time   NA 136 12/03/2019 0850   K 4.4 12/03/2019 0850   CL 97 12/03/2019 0850   CO2 20 12/03/2019 0850   GLUCOSE 85 12/03/2019 0850   GLUCOSE 174 (H) 07/03/2016 0938   BUN 22 12/03/2019 0850   CREATININE 1.02 12/03/2019 0850   CREATININE 1.02 07/03/2016 0938   CALCIUM 10.2 12/03/2019 0850   PROT 8.2 12/03/2019 0850   ALBUMIN 4.8 12/03/2019 0850   AST 20 12/03/2019 0850   ALT 18 12/03/2019 0850   ALKPHOS 69 12/03/2019 0850   BILITOT 0.4 12/03/2019 0850   GFRNONAA 77 12/03/2019 0850   GFRNONAA 80 07/03/2016 0938   GFRAA 89 12/03/2019 0850   GFRAA >89 07/03/2016 0938       Component Value Date/Time   WBC 16.5 (H) 12/03/2019 0850   RBC 5.39 12/03/2019 0850   HGB 16.9 12/03/2019  0850   HCT 49.0 12/03/2019 0850   PLT 436 12/03/2019 0850   MCV 91 12/03/2019 0850   MCH 31.4 12/03/2019 0850   MCHC 34.5 12/03/2019 0850   RDW 13.2 12/03/2019 0850   LYMPHSABS 6.3 (H) 12/03/2019 0850   EOSABS 0.1 12/03/2019 0850   BASOSABS 0.1 12/03/2019 0850    No results found for: POCLITH, LITHIUM   No results found for: PHENYTOIN, PHENOBARB, VALPROATE, CBMZ   .res Assessment: Plan:   Will continue current plan of care since target signs and symptoms are well controlled without any tolerability issues. Discussed that he may wish to try taking Gabapentin about 2 hours before bedtime to allow more time for it to become effective for RLS.  Continue Sertraline 200 mg po qd for mood and anxiety.  Continue Remeron 30 mg po QHS for mood and insomnia.  Continue Gabapentin 300 mg po qd and 600 mg in the evening for RLS and off label indication for anxiety.  Pt to follow-up in 6 months or sooner if clinically indicated.  Patient advised to contact office with any questions, adverse effects, or acute worsening in signs and symptoms.  Keegan was seen today for follow-up.  Diagnoses and all orders for this visit:  RLS (restless legs syndrome) -     gabapentin (NEURONTIN) 300 MG capsule; TAKE 1 CAPSULE MID-DAY AND 2 CAPSULES BEFORE BEDTIME  Major depressive disorder with current active episode, unspecified depression episode severity, unspecified whether recurrent -     mirtazapine (REMERON) 30 MG tablet; Take 1 tablet (30 mg total) by mouth at bedtime. -  sertraline (ZOLOFT) 100 MG tablet; Take 2 tablets (200 mg total) by mouth daily.  Insomnia, unspecified type -     mirtazapine (REMERON) 30 MG tablet; Take 1 tablet (30 mg total) by mouth at bedtime.  Obsessive-compulsive disorder, unspecified type -     sertraline (ZOLOFT) 100 MG tablet; Take 2 tablets (200 mg total) by mouth daily.     Please see After Visit Summary for patient specific instructions.  Future Appointments   Date Time Provider [place]  05/06/2020  8:00 AM Trinna Post, PA-C [facility]  10/21/2020  9:00 AM Thayer Headings, PMHNP [facility] None    No orders of the defined types were placed in this encounter.   -------------------------------

## 2020-04-22 ENCOUNTER — Other Ambulatory Visit: Payer: Self-pay | Admitting: Physician Assistant

## 2020-04-22 DIAGNOSIS — I10 Essential (primary) hypertension: Secondary | ICD-10-CM

## 2020-04-22 DIAGNOSIS — E1129 Type 2 diabetes mellitus with other diabetic kidney complication: Secondary | ICD-10-CM

## 2020-04-25 ENCOUNTER — Other Ambulatory Visit: Payer: Self-pay | Admitting: Physician Assistant

## 2020-04-25 NOTE — Telephone Encounter (Signed)
  Notes to clinic:  medication was filled by a historical provider  Review for continue use and refill    Requested Prescriptions  Pending Prescriptions Disp Refills   BREO ELLIPTA 100-25 MCG/INH AEPB [Pharmacy Med Name: BREO ELLIPTA 100-25MCG ORAL INH(30)] 60 each     Sig: INHALE 1 PUFF INTO THE LUNGS DAILY      Pulmonology:  [employer] Passed - 04/25/2020  9:52 AM      Passed - Valid encounter within last 12 months    Recent Outpatient Visits           4 months ago Annual physical exam   [facility] Jodi Marble, Adriana M, PA-C   9 months ago Type 2 diabetes mellitus with microalbuminuria, with long-term current use of insulin [facility])   [facility] [place], [place], PA-C   1 year ago Type 2 diabetes mellitus with microalbuminuria, with long-term current use of insulin [facility])   [facility] [place], Donald, PA-C   1 year ago Type 2 diabetes mellitus with microalbuminuria, with long-term current use of insulin [facility])   [facility] [place], [place], [place]   1 year ago Type 2 diabetes mellitus with microalbuminuria, with long-term current use of insulin [facility])   [facility] [place], [place], [place]       Future Appointments             In 1 week Trey Sailors, PA-C [facility]

## 2020-05-06 ENCOUNTER — Encounter: Payer: Self-pay | Admitting: Physician Assistant

## 2020-05-06 ENCOUNTER — Other Ambulatory Visit: Payer: Self-pay

## 2020-05-06 ENCOUNTER — Ambulatory Visit (INDEPENDENT_AMBULATORY_CARE_PROVIDER_SITE_OTHER): Payer: Medicare Other | Admitting: Physician Assistant

## 2020-05-06 VITALS — BP 147/92 | HR 104 | Temp 99.4°F | Wt 232.2 lb

## 2020-05-06 DIAGNOSIS — Z794 Long term (current) use of insulin: Secondary | ICD-10-CM | POA: Diagnosis not present

## 2020-05-06 DIAGNOSIS — I10 Essential (primary) hypertension: Secondary | ICD-10-CM

## 2020-05-06 DIAGNOSIS — R809 Proteinuria, unspecified: Secondary | ICD-10-CM | POA: Diagnosis not present

## 2020-05-06 DIAGNOSIS — D72829 Elevated white blood cell count, unspecified: Secondary | ICD-10-CM

## 2020-05-06 DIAGNOSIS — E1129 Type 2 diabetes mellitus with other diabetic kidney complication: Secondary | ICD-10-CM

## 2020-05-06 DIAGNOSIS — E1169 Type 2 diabetes mellitus with other specified complication: Secondary | ICD-10-CM | POA: Diagnosis not present

## 2020-05-06 DIAGNOSIS — E785 Hyperlipidemia, unspecified: Secondary | ICD-10-CM

## 2020-05-06 LAB — POCT GLYCOSYLATED HEMOGLOBIN (HGB A1C)
Est. average glucose Bld gHb Est-mCnc: 151
Hemoglobin A1C: 6.9 % — AB (ref 4.0–5.6)

## 2020-05-06 MED ORDER — METFORMIN HCL 1000 MG PO TABS: ORAL_TABLET | ORAL | 3 refills | Status: AC

## 2020-05-06 MED ORDER — LOSARTAN POTASSIUM 50 MG PO TABS: ORAL_TABLET | ORAL | 1 refills | Status: AC

## 2020-05-06 NOTE — Progress Notes (Signed)
Established patient visit   Patient: Alan George.   DOB: [birth year]   65 y.o. Male  MRN: [id number] Visit Date: 05/06/2020  Today's healthcare provider: Trinna Post, PA-C   Chief Complaint  Patient presents with  . Diabetes  I,Adana Marik M Kaynen Minner,acting as a scribe for [employer], PA-C.,have documented all relevant documentation on the behalf of Trinna Post, PA-C,as directed by  Trinna Post, PA-C while in the presence of Trinna Post, PA-C.  Subjective    HPI   May be moving to [facility] with his son due to change in housing situation.   Diabetes Mellitus Type II, Follow-up  Lab Results  Component Value Date   HGBA1C 6.9 (A) 05/06/2020   HGBA1C 7.5 (H) 12/03/2019   HGBA1C 5.9 (A) 07/30/2019   Wt Readings from Last 3 Encounters:  05/06/20 232 lb 3.2 oz (105.3 kg)  12/03/19 240 lb 9.6 oz (109.1 kg)  07/30/19 230 lb (104.3 kg)   Last seen for diabetes 5 months ago.  Management since then includes continue current medication. He reports good compliance with treatment. He is not having side effects.  Symptoms: No fatigue No foot ulcerations  No appetite changes No nausea  No paresthesia of the feet  No polydipsia  No polyuria No visual disturbances   No vomiting     Home blood sugar records: fasting range: 91,638,466  Episodes of hypoglycemia? No    Current insulin regiment:Lantus Most Recent Eye Exam:  Current exercise: no regular exercise Current diet habits: well balanced  Pertinent Labs: Lab Results  Component Value Date   CHOL 104 12/03/2019   HDL 43 12/03/2019   LDLCALC 43 12/03/2019   TRIG 90 12/03/2019   CHOLHDL 2.4 12/03/2019   Lab Results  Component Value Date   NA 136 12/03/2019   K 4.4 12/03/2019   CREATININE 1.02 12/03/2019   GFRNONAA 77 12/03/2019   GFRAA 89 12/03/2019   GLUCOSE 85 12/03/2019      ---------------------------------------------------------------------------------------------------      Medications: Outpatient Medications Prior to Visit  Medication Sig  . albuterol (PROAIR HFA) 108 (90 Base) MCG/ACT inhaler Inhale 2 puffs into the lungs every 4 (four) hours as needed for wheezing or shortness of breath.  [name] atorvastatin (LIPITOR) 80 MG tablet TAKE 1 TABLET(80 MG) BY MOUTH DAILY  . BREO ELLIPTA 100-25 MCG/INH AEPB INHALE 1 PUFF INTO THE LUNGS DAILY  . cyclobenzaprine (FLEXERIL) 10 MG tablet Take by mouth.  . gabapentin (NEURONTIN) 300 MG capsule TAKE 1 CAPSULE MID-DAY AND 2 CAPSULES BEFORE BEDTIME  . glucose blood (CONTOUR NEXT TEST) test strip CHECK BLOOD SUGAR DAILY  . HYDROmorphone (DILAUDID) 4 MG tablet take 0.5 tablet by mouth every 4 hours if needed  . insulin glargine (LANTUS SOLOSTAR) 100 UNIT/ML Solostar Pen ADMINISTER 30 UNITS UNDER THE SKIN DAILY  . Melatonin 1 MG TABS Take by mouth.  . mirtazapine (REMERON) 30 MG tablet Take 1 tablet (30 mg total) by mouth at bedtime.  [name] NARCAN 4 MG/0.1ML LIQD nasal spray kit   . omeprazole (PRILOSEC) 20 MG capsule TAKE 1 CAPSULE(20 MG) BY MOUTH DAILY  . sertraline (ZOLOFT) 100 MG tablet Take 2 tablets (200 mg total) by mouth daily.  . [DISCONTINUED] losartan (COZAAR) 50 MG tablet TAKE 1 TABLET(50 MG) BY MOUTH DAILY  . [DISCONTINUED] metFORMIN (GLUCOPHAGE) 1000 MG tablet TAKE 1 TABLET(1000 MG) BY MOUTH TWICE DAILY WITH A MEAL   No facility-administered medications prior to visit.  Review of Systems  Constitutional: Negative.   Respiratory: Negative.   Cardiovascular: Negative.   Hematological: Negative.        Objective    BP (!) 147/92 (BP Location: Right Arm, Patient Position: Sitting, Cuff Size: Normal)   Pulse (!) 104   Temp 99.4 F (37.4 C) (Oral)   Wt 232 lb 3.2 oz (105.3 kg)   SpO2 97%   BMI 31.49 kg/m     Physical Exam Constitutional:      Appearance: Normal appearance.  Skin:    General:  Skin is warm and dry.  Neurological:     General: No focal deficit present.     Mental Status: He is alert and oriented to person, place, and time.  Psychiatric:        Mood and Affect: Mood normal.        Behavior: Behavior normal.       Results for orders placed or performed in visit on 05/06/20  POCT glycosylated hemoglobin (Hb A1C)  Result Value Ref Range   Hemoglobin A1C 6.9 (A) 4.0 - 5.6 %   HbA1c POC (<> result, manual entry)     HbA1c, POC (prediabetic range)     HbA1c, POC (controlled diabetic range)     Est. average glucose Bld gHb Est-mCnc 151     Assessment & Plan    1. Leukocytosis, unspecified type  Recommend hematology referral, may do this in [place].   2. Type 2 diabetes mellitus with microalbuminuria, with long-term current use of insulin (HCC)  - POCT glycosylated hemoglobin (Hb A1C) - metFORMIN (GLUCOPHAGE) 1000 MG tablet; TAKE 1 TABLET(1000 MG) BY MOUTH TWICE DAILY WITH A MEAL  Dispense: 180 tablet; Refill: 3 - losartan (COZAAR) 50 MG tablet; TAKE 1 TABLET(50 MG) BY MOUTH DAILY  Dispense: 90 tablet; Refill: 1  3. Primary hypertension  Continue medications.   4. Hyperlipidemia associated with type 2 diabetes mellitus ([place])  Continue statin.  5. Essential hypertension  - losartan (COZAAR) 50 MG tablet; TAKE 1 TABLET(50 MG) BY MOUTH DAILY  Dispense: 90 tablet; Refill: 1   Return in about 4 months (around 09/05/2020), or if symptoms worsen or fail to improve.      ITrinna Post, PA-C, have reviewed all documentation for this visit. The documentation on 05/06/20 for the exam, diagnosis, procedures, and orders are all accurate and complete.  The entirety of the information documented in the History of Present Illness, Review of Systems and Physical Exam were personally obtained by me. Portions of this information were initially documented by [facility] and reviewed by me for thoroughness and accuracy.     Paulene Floor  [facility] [phone] (phone) [phone] (fax)  [place]

## 2020-05-06 NOTE — Patient Instructions (Signed)
Diabetes Mellitus and Exercise Exercising regularly is important for overall health, especially for people who have diabetes mellitus. Exercising is not only about losing weight. It has many other health benefits, such as increasing muscle strength and bone density and reducing body fat and stress. This leads to improved fitness, flexibility, and endurance, all of which result in better overall health. What are the benefits of exercise if I have diabetes? Exercise has many benefits for people with diabetes. They include:  Helping to lower and control blood sugar (glucose).  Helping the body to respond better to the hormone insulin by improving insulin sensitivity.  Reducing how much insulin the body needs.  Lowering the risk for heart disease by: ? Lowering "bad" cholesterol and triglyceride levels. ? Increasing "good" cholesterol levels. ? Lowering blood pressure. ? Lowering blood glucose levels. What is my activity plan? Your health care provider or certified diabetes educator can help you make a plan for the type and frequency of exercise that works for you. This is called your activity plan. Be sure to:  Get at least 150 minutes of medium-intensity or high-intensity exercise each week. Exercises may include brisk walking, biking, or water aerobics.  Do stretching and strengthening exercises, such as yoga or weight lifting, at least 2 times a week.  Spread out your activity over at least 3 days of the week.  Get some form of physical activity each day. ? Do not go more than 2 days in a row without some kind of physical activity. ? Avoid being inactive for more than 90 minutes at a time. Take frequent breaks to walk or stretch.  Choose exercises or activities that you enjoy. Set realistic goals.  Start slowly and gradually increase your exercise intensity over time.   How do I manage my diabetes during exercise? Monitor your blood glucose  Check your blood glucose before and  after exercising. If your blood glucose is: ? 240 mg/dL (13.3 mmol/L) or higher before you exercise, check your urine for ketones. These are chemicals created by the liver. If you have ketones in your urine, do not exercise until your blood glucose returns to normal. ? 100 mg/dL (5.6 mmol/L) or lower, eat a snack containing 15-20 grams of carbohydrate. Check your blood glucose 15 minutes after the snack to make sure that your glucose level is above 100 mg/dL (5.6 mmol/L) before you start your exercise.  Know the symptoms of low blood glucose (hypoglycemia) and how to treat it. Your risk for hypoglycemia increases during and after exercise. Follow these tips and your health care provider's instructions  Keep a carbohydrate snack that is fast-acting for use before, during, and after exercise to help prevent or treat hypoglycemia.  Avoid injecting insulin into areas of the body that are going to be exercised. For example, avoid injecting insulin into: ? Your arms, when you are about to play tennis. ? Your legs, when you are about to go jogging.  Keep records of your exercise habits. Doing this can help you and your health care provider adjust your diabetes management plan as needed. Write down: ? Food that you eat before and after you exercise. ? Blood glucose levels before and after you exercise. ? The type and amount of exercise you have done.  Work with your health care provider when you start a new exercise or activity. He or she may need to: ? Make sure that the activity is safe for you. ? Adjust your insulin, other medicines, and food that   you eat.  Drink plenty of water while you exercise. This prevents loss of water (dehydration) and problems caused by a lot of heat in the body (heat stroke).   Where to find more information  American Diabetes Association: www.diabetes.org Summary  Exercising regularly is important for overall health, especially for people who have diabetes  mellitus.  Exercising has many health benefits. It increases muscle strength and bone density and reduces body fat and stress. It also lowers and controls blood glucose.  Your health care provider or certified diabetes educator can help you make an activity plan for the type and frequency of exercise that works for you.  Work with your health care provider to make sure any new activity is safe for you. Also work with your health care provider to adjust your insulin, other medicines, and the food you eat. This information is not intended to replace advice given to you by your health care provider. Make sure you discuss any questions you have with your health care provider. Document Revised: 11/03/2018 Document Reviewed: 11/03/2018 Elsevier Patient Education  2021 Elsevier Inc.  

## 2020-05-26 ENCOUNTER — Other Ambulatory Visit: Payer: Self-pay | Admitting: Physician Assistant

## 2020-05-26 DIAGNOSIS — E785 Hyperlipidemia, unspecified: Secondary | ICD-10-CM

## 2020-06-03 ENCOUNTER — Other Ambulatory Visit: Payer: Self-pay | Admitting: Physician Assistant

## 2020-06-03 DIAGNOSIS — K219 Gastro-esophageal reflux disease without esophagitis: Secondary | ICD-10-CM

## 2020-07-01 DIAGNOSIS — M5442 Lumbago with sciatica, left side: Secondary | ICD-10-CM | POA: Diagnosis not present

## 2020-07-01 DIAGNOSIS — M7062 Trochanteric bursitis, left hip: Secondary | ICD-10-CM | POA: Diagnosis not present

## 2020-07-01 DIAGNOSIS — M25552 Pain in left hip: Secondary | ICD-10-CM | POA: Diagnosis not present

## 2020-07-01 DIAGNOSIS — Z5181 Encounter for therapeutic drug level monitoring: Secondary | ICD-10-CM | POA: Diagnosis not present

## 2020-07-01 DIAGNOSIS — M25561 Pain in right knee: Secondary | ICD-10-CM | POA: Diagnosis not present

## 2020-07-01 DIAGNOSIS — Z79891 Long term (current) use of opiate analgesic: Secondary | ICD-10-CM | POA: Diagnosis not present

## 2020-07-01 DIAGNOSIS — G8929 Other chronic pain: Secondary | ICD-10-CM | POA: Diagnosis not present

## 2020-07-01 DIAGNOSIS — M25562 Pain in left knee: Secondary | ICD-10-CM | POA: Diagnosis not present

## 2020-07-01 DIAGNOSIS — M1610 Unilateral primary osteoarthritis, unspecified hip: Secondary | ICD-10-CM | POA: Diagnosis not present

## 2020-07-20 ENCOUNTER — Telehealth: Payer: Self-pay

## 2020-07-20 MED ORDER — BREO ELLIPTA 100-25 MCG/INH IN AEPB: 1.0000 | INHALATION_SPRAY | Freq: Every day | RESPIRATORY_TRACT | 1 refills | Status: AC

## 2020-07-20 NOTE — Telephone Encounter (Signed)
[facility] faxed refill request for the following medications:   BREO ELLIPTA 100-25 MCG/INH AEPB   Please advise.  

## 2020-08-14 ENCOUNTER — Other Ambulatory Visit: Payer: Self-pay | Admitting: Family Medicine

## 2020-08-14 DIAGNOSIS — K219 Gastro-esophageal reflux disease without esophagitis: Secondary | ICD-10-CM

## 2020-09-15 ENCOUNTER — Telehealth: Payer: Self-pay

## 2020-09-15 NOTE — Telephone Encounter (Signed)
Looks like patient is scheduled with [facility] 11/24/20. No appointment schedule with [facility].  Is it ok to fill.

## 2020-09-15 NOTE — Telephone Encounter (Signed)
[facility] faxed refill request for the following medications:  insulin glargine (LANTUS SOLOSTAR) 100 UNIT/ML Solostar Pen   Please advise.

## 2020-09-19 ENCOUNTER — Telehealth: Payer: Self-pay | Admitting: Physician Assistant

## 2020-09-19 DIAGNOSIS — R809 Proteinuria, unspecified: Secondary | ICD-10-CM

## 2020-09-19 DIAGNOSIS — Z794 Long term (current) use of insulin: Secondary | ICD-10-CM

## 2020-09-19 DIAGNOSIS — E1129 Type 2 diabetes mellitus with other diabetic kidney complication: Secondary | ICD-10-CM

## 2020-09-19 MED ORDER — LANTUS SOLOSTAR 100 UNIT/ML ~~LOC~~ SOPN: PEN_INJECTOR | SUBCUTANEOUS | 0 refills | Status: AC

## 2020-09-19 NOTE — Telephone Encounter (Signed)
[facility] faxed refill request for the following medications:   LANTUS SOLOSTAR 100 UNIT/ML Solostar Pen   Not on current med list    Please advise.

## 2020-10-11 ENCOUNTER — Ambulatory Visit: Admit: 2020-10-11 | Discharge: 2020-10-11 | Payer: MEDICARE | Attending: Family Medicine | Primary: Family Medicine

## 2020-10-11 ENCOUNTER — Encounter

## 2020-10-11 DIAGNOSIS — E1165 Type 2 diabetes mellitus with hyperglycemia: Secondary | ICD-10-CM

## 2020-10-11 LAB — COMPREHENSIVE METABOLIC PANEL
ALT: 22 U/L (ref 12–65)
AST: 16 U/L (ref 15–37)
Albumin/Globulin Ratio: 0.9 — ABNORMAL LOW (ref 1.2–3.5)
Albumin: 3.6 g/dL (ref 3.2–4.6)
Alk Phosphatase: 73 U/L (ref 50–136)
Anion Gap: 6 mmol/L — ABNORMAL LOW (ref 7–16)
BUN: 14 MG/DL (ref 8–23)
CO2: 25 mmol/L (ref 21–32)
Calcium: 9.5 MG/DL (ref 8.3–10.4)
Chloride: 107 mmol/L (ref 98–107)
Creatinine: 0.9 MG/DL (ref 0.8–1.5)
GFR African American: >60 mL/min/{1.73_m2} (ref >60)
GFR Non-African American: >60 mL/min/{1.73_m2} (ref >60)
Globulin: 4 g/dL — ABNORMAL HIGH (ref 2.3–3.5)
Glucose: 102 mg/dL — ABNORMAL HIGH (ref 65–100)
Potassium: 4.5 mmol/L (ref 3.5–5.1)
Sodium: 138 mmol/L (ref 136–145)
Total Bilirubin: 0.3 MG/DL (ref 0.2–1.1)
Total Protein: 7.6 g/dL (ref 6.3–8.2)

## 2020-10-11 LAB — LIPID PANEL
Chol/HDL Ratio: 2.5
Cholesterol, Total: 108 MG/DL (ref <200)
HDL: 43 MG/DL (ref 40–60)
LDL Calculated: 50 MG/DL (ref <100)
Triglycerides: 75 MG/DL (ref 35–150)
VLDL Cholesterol Calculated: 15 MG/DL (ref 6.0–23.0)

## 2020-10-11 LAB — CBC WITH AUTO DIFFERENTIAL
Absolute Eos #: 0.2 10*3/uL (ref 0.0–0.8)
Absolute Immature Granulocyte: 0 10*3/uL (ref 0.0–0.5)
Absolute Lymph #: 4.7 10*3/uL — ABNORMAL HIGH (ref 0.5–4.6)
Absolute Mono #: 0.9 10*3/uL (ref 0.1–1.3)
Basophils Absolute: 0.1 10*3/uL (ref 0.0–0.2)
Basophils: 1 % (ref 0.0–2.0)
Eosinophils %: 1 % (ref 0.5–7.8)
Hematocrit: 47.1 % (ref 41.1–50.3)
Hemoglobin: 15.2 g/dL (ref 13.6–17.2)
Immature Granulocytes: 0 % (ref 0.0–5.0)
Lymphocytes: 37 % (ref 13–44)
MCH: 31.2 PG (ref 26.1–32.9)
MCHC: 32.3 g/dL (ref 31.4–35.0)
MCV: 96.7 FL (ref 79.6–97.8)
MPV: 10.8 FL (ref 9.4–12.3)
Monocytes: 7 % (ref 4.0–12.0)
Platelets: 442 10*3/uL (ref 150–450)
RBC: 4.87 M/uL (ref 4.23–5.6)
RDW: 14.2 % (ref 11.9–14.6)
Seg Neutrophils: 54 % (ref 43–78)
Segs Absolute: 6.7 10*3/uL (ref 1.7–8.2)
WBC: 12.6 10*3/uL — ABNORMAL HIGH (ref 4.3–11.1)
nRBC: 0 10*3/uL (ref 0.0–0.2)

## 2020-10-11 LAB — HEPATITIS C ANTIBODY: Hepatitis C Ab: NONREACTIVE

## 2020-10-11 LAB — AMB POC URINALYSIS DIP STICK AUTO W/O MICRO
Bilirubin, Urine, POC: NEGATIVE
Blood (UA POC): NEGATIVE
Glucose, Urine, POC: 100
KETONES, Urine, POC: NEGATIVE
Leukocyte Esterase, Urine, POC: NEGATIVE
Nitrite, Urine, POC: NEGATIVE
Protein, Urine, POC: 30
Specific Gravity, Urine, POC: 1.025 (ref 1.001–1.035)
Urobilinogen, POC: 1
pH, Urine, POC: 6 (ref 4.6–8.0)

## 2020-10-11 LAB — HIV 1/2 AG/AB, 4TH GENERATION,W RFLX CONFIRM: HIV 1/2 Interp: NONREACTIVE

## 2020-10-11 LAB — TSH: TSH, 3RD GENERATION: 8.82 u[IU]/mL — ABNORMAL HIGH (ref 0.358–3.740)

## 2020-10-11 MED ORDER — METFORMIN HCL 1000 MG PO TABS
1000 MG | ORAL_TABLET | ORAL | 5 refills | Status: DC
Start: 2020-10-11 — End: 2021-02-27

## 2020-10-11 MED ORDER — SERTRALINE HCL 100 MG PO TABS
100 MG | ORAL_TABLET | Freq: Every day | ORAL | 5 refills | Status: AC
Start: 2020-10-11 — End: ?

## 2020-10-11 MED ORDER — ATORVASTATIN CALCIUM 80 MG PO TABS
80 MG | ORAL_TABLET | ORAL | 5 refills | Status: DC
Start: 2020-10-11 — End: 2021-02-27

## 2020-10-11 MED ORDER — LOSARTAN POTASSIUM 50 MG PO TABS
50 MG | ORAL_TABLET | ORAL | 5 refills | Status: AC
Start: 2020-10-11 — End: ?

## 2020-10-11 MED ORDER — CHLORTHALIDONE 25 MG PO TABS
25 MG | ORAL_TABLET | Freq: Every day | ORAL | 5 refills | Status: DC
Start: 2020-10-11 — End: 2021-02-27

## 2020-10-11 MED ORDER — MULTI-VITAMIN/MINERALS PO TABS
ORAL_TABLET | ORAL | 3 refills | Status: DC
Start: 2020-10-11 — End: 2021-02-27
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
## 2020-10-11 NOTE — Patient Instructions (Addendum)
Diabetic teaching, diet, increase vegetables- at least 5 portions a day, roughly half plate, beans, whole grains, grilled or baked white meats , dairy products, exercise at least an hr - brisk walk aerobic of choice, No sugar/ suggary drinks including juice/ fats/ fried foods. Decease starch- bread/ potato/ rice. ( It takes roughly 40 minute of walk  To burn a 12 oz regular drink/ juice). Cutting out 12 oz drink a day equals to roughly 4 lb weight a yr! Decrease screen time- TV, Video, computer , cell phones- recommended less than 2 hrs a day  Type 2 diabetes is very common, obesity is the main reason for diabetes and  insulin resistance, most of the type 2 diabetes can be cured by weight management exercise.. Most type 2 diabetes has high insulin level  and high insulin level causes most of diabetic complications microvascular and macrovascular, damage to kidneys, eyes , cardiovascular , and neuropathy,, medications that correct insulin resistance such as metformin has been shown to decrease these complications by lowering insulin level and correcting insulin resistance.  Frequent blood sugar checking is unnecessary    Frequent blood sugar checking is not necessity, normal person without diabetess fasting blood sugar is usually less than 105, after 3 -4 weeks of treatment, either diet alone, or diet and metformin, if fasting blood sugar less than 120, frequent BS checking is not necessary and continue diet exercise Metformin is enough.  Starting metformin early and preventing diabetic complications.  Exercise and weight management is most important    Adding insulin and continuing increasing dose,  not usually prevent diabetic complications . Some newer medications that do not cause low BS, may help diabeted by supressing apetite and making pee sugar , may help loose weight ,  may be more beneficial when over weight, but are quite expensive and often not covered by insurance, long term benefits are not known, and  do have lot of side effects and risks    High blood sugar less than 300 usually causes no symptoms and patient is unaware, of the diabetes, and causes a significant diabetic complications and #1 cause of losing legs , kidneys and eye sight and cardiovascular risk     Focusing on blood sugar does not prevent diabetic complication, but diet, exercise , weight management ,  metformin early on , do prevent diabetic complications    If insulin do become necessary, usually 30-40 unit long acting insulin taken bed time, with small frequent meals may be more beneficial, keeping fasting blood sugar less than 140, through diet , exercise, weight management and metformin- recommended as first line diabetic medication with GFR more than 30 by all most medical organizations, and need be continued with or without insulin. In normal weight persons BMI less than 25, may be insulin deficient and Insulin log acting usually less than 30 units may help , with or without metformin if fasting BS more than 140  Statins,  cholesterol lowering agents, simvastatin Lipitor pravastatin, has unequivocal evidence of decreased heart attack strokes, long-term benefits,  with very little risks,  side effects, in spite of all the  the negative publicity, strongly recommended, can reduce dose to half pill , not stop. If diabetic and CKD benefit of taking statins are profound, irrespective of baseline LDL , even if less than 70.  High intensity statin therapy is recommended inpatient with stable coronary artery disease history, irrespective of LDL level by American heart association And [employer] of cardiology   .        Increase fruits vegetables, fiber, whole grains, beans, exercise, tree nuts, will decrease risk of heart attacks and strokes, may reduce cancer risks     once a day multivitamin such as Centrum silver store brand, due to benefit of folic acid vitamin D, has already mineral vitamin is recommended doses.  Multiple different  vitamins not recommended may carry increased risk, including vitamin E, take foods rich in omega 3 and fibre, pills are not recommended, including omega 3 in high doses, may have increased risks     All pain medication carry significant risk

## 2020-10-12 LAB — HEMOGLOBIN A1C
Hemoglobin A1C: 7.7 % — ABNORMAL HIGH (ref 4.8–5.6)
eAG: 174 mg/dL

## 2020-10-12 LAB — MICROALBUMIN / CREATININE URINE RATIO
Creatinine, Ur: 122 mg/dL
Microalbumin Creatinine Ratio: 93 mg/g
Microalbumin, Random Urine: 11.4 MG/DL — ABNORMAL HIGH (ref <2.0)

## 2020-10-12 MED ORDER — LEVOTHYROXINE SODIUM 50 MCG PO TABS
50 MCG | ORAL_TABLET | Freq: Every day | ORAL | 1 refills | Status: DC
Start: 2020-10-12 — End: 2021-02-27

## 2020-10-12 NOTE — Addendum Note (Signed)
Addended by: Pincus Sanes on: 10/12/2020 09:58 AM     Modules accepted: Orders

## 2020-10-14 NOTE — Telephone Encounter (Signed)
-----   Message from Pincus Sanes, MD sent at 10/12/2020  9:58 AM EDT -----  Diabetes hemoglobin A1c 7.7 continue metformin diabetic diet insulin 30 units bedtime schedule follow-up.  Abnormal TSH hypothyroidism start levothyroxine 50 mcg daily recheck in 8 weeks

## 2020-10-14 NOTE — Telephone Encounter (Signed)
LVM to call office regarding lab results

## 2020-10-21 ENCOUNTER — Ambulatory Visit: Payer: Medicare Other | Admitting: Psychiatry

## 2020-10-25 ENCOUNTER — Ambulatory Visit: Admit: 2020-10-25 | Discharge: 2020-10-25 | Payer: MEDICARE | Attending: Family Medicine | Primary: Family Medicine

## 2020-10-25 DIAGNOSIS — E1165 Type 2 diabetes mellitus with hyperglycemia: Secondary | ICD-10-CM

## 2020-10-25 LAB — POCT FECAL IMMUNOCHEMICAL TEST (FIT) (G0328): Occult Blood Fecal: POSITIVE

## 2020-10-25 MED ORDER — SEMAGLUTIDE (1 MG/DOSE) 4 MG/3ML SC SOPN
4 MG/3ML | SUBCUTANEOUS | 5 refills | Status: DC
Start: 2020-10-25 — End: 2021-01-16

## 2020-10-25 NOTE — Addendum Note (Signed)
Addended by: Annette Stable C on: 10/25/2020 11:55 AM     Modules accepted: Orders
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
## 2020-10-25 NOTE — Addendum Note (Signed)
Addended by: Loel Dubonnet on: 10/25/2020 12:05 PM     Modules accepted: Orders

## 2020-10-25 NOTE — Patient Instructions (Addendum)
Diabetic teaching, diet, increase vegetables- at least 5 portions a day, roughly half plate, beans, whole grains, grilled or baked white meats , dairy products, exercise at least an hr - brisk walk aerobic of choice, No sugar/ suggary drinks including juice/ fats/ fried foods. Decease starch- bread/ potato/ rice. ( It takes roughly 40 minute of walk  To burn a 12 oz regular drink/ juice). Cutting out 12 oz drink a day equals to roughly 4 lb weight a yr! Decrease screen time- TV, Video, computer , cell phones- recommended less than 2 hrs a day  Type 2 diabetes is very common, obesity is the main reason for diabetes and  insulin resistance, most of the type 2 diabetes can be cured by weight management exercise.. Most type 2 diabetes has high insulin level  and high insulin level causes most of diabetic complications microvascular and macrovascular, damage to kidneys, eyes , cardiovascular , and neuropathy,, medications that correct insulin resistance such as metformin has been shown to decrease these complications by lowering insulin level and correcting insulin resistance.  Frequent blood sugar checking is unnecessary    Frequent blood sugar checking is not necessity, normal person without diabetess fasting blood sugar is usually less than 105, after 3 -4 weeks of treatment, either diet alone, or diet and metformin, if fasting blood sugar less than 120, frequent BS checking is not necessary and continue diet exercise Metformin is enough.  Starting metformin early and preventing diabetic complications.  Exercise and weight management is most important    Adding insulin and continuing increasing dose,  not usually prevent diabetic complications . Some newer medications that do not cause low BS, may help diabeted by supressing apetite and making pee sugar , may help loose weight ,  may be more beneficial when over weight, but are quite expensive and often not covered by insurance, long term benefits are not known, and  do have lot of side effects and risks    High blood sugar less than 300 usually causes no symptoms and patient is unaware, of the diabetes, and causes a significant diabetic complications and #1 cause of losing legs , kidneys and eye sight and cardiovascular risk     Focusing on blood sugar does not prevent diabetic complication, but diet, exercise , weight management ,  metformin early on , do prevent diabetic complications    If insulin do become necessary, usually 30-40 unit long acting insulin taken bed time, with small frequent meals may be more beneficial, keeping fasting blood sugar less than 140, through diet , exercise, weight management and metformin- recommended as first line diabetic medication with GFR more than 30 by all most medical organizations, and need be continued with or without insulin. In normal weight persons BMI less than 25, may be insulin deficient and Insulin log acting usually less than 30 units may help , with or without metformin if fasting BS more than 140  Statins,  cholesterol lowering agents, simvastatin Lipitor pravastatin, has unequivocal evidence of decreased heart attack strokes, long-term benefits,  with very little risks,  side effects, in spite of all the  the negative publicity, strongly recommended, can reduce dose to half pill , not stop. If diabetic and CKD benefit of taking statins are profound, irrespective of baseline LDL , even if less than 70.  High intensity statin therapy is recommended inpatient with stable coronary artery disease history, irrespective of LDL level by American heart association [employer] of cardiology     .  Increase fruits vegetables, fiber, whole grains, beans, exercise, tree nuts, will decrease risk of heart attacks and strokes, may reduce cancer risks     once a day multivitamin such as Cent[employer] silver store brand, due to benefit of folic acid vitamin D, has already mineral vitamin is recommended doses.  Multiple different  vitamins not recommended may carry increased risk, including vitamin E, take foods rich in omega 3 and fibre, pills are not recommended, including omega 3 in high doses, may have increased risks    All pain medication carry significant risk use sparingly continue regular exercise as tolerated    Flu shot Pneumovax recommended if not taken today    Continue metformin 1000 mg twice a day diabetic diet, can take Ozempic if covered by insurance preferred over insulin, plus minus Jardiance    Pneumovax highly recommended particularly due to history of splenectomy diabetes declined flu shot recommended

## 2020-10-26 NOTE — Telephone Encounter (Signed)
-----   Message from Pincus Sanes, MD sent at 10/25/2020  2:32 PM EDT -----  Gastroenterology consultation as soon as possible for colonoscopy colon cancer screening

## 2020-10-26 NOTE — Telephone Encounter (Signed)
Patient was notified at that visit that his FIT STOOL was positive and given the [facility] referral information at the visit.

## 2020-12-18 IMAGING — CR DG KNEE COMPLETE 4+V*L*
1 series · 4 of 4 positions shown · non-contrast
Comparison: None.

CLINICAL DATA: Bilateral chronic knee pain without recent injury.

EXAM:
LEFT KNEE - COMPLETE 4+ VIEW

[Series 1: dg knee complete 4 views left · 0.14mm/px · 4 of 4 slices shown]
[im 1/4]
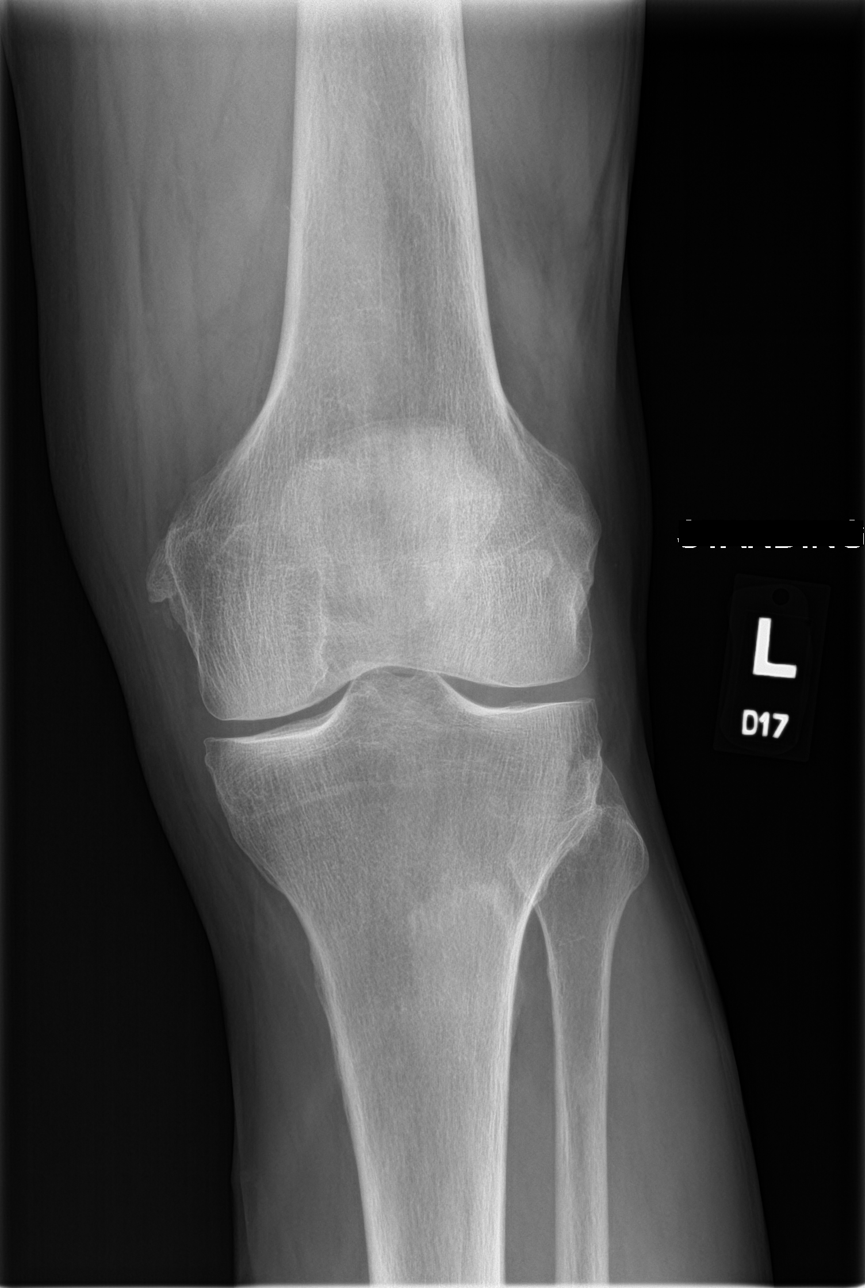
[im 2/4]
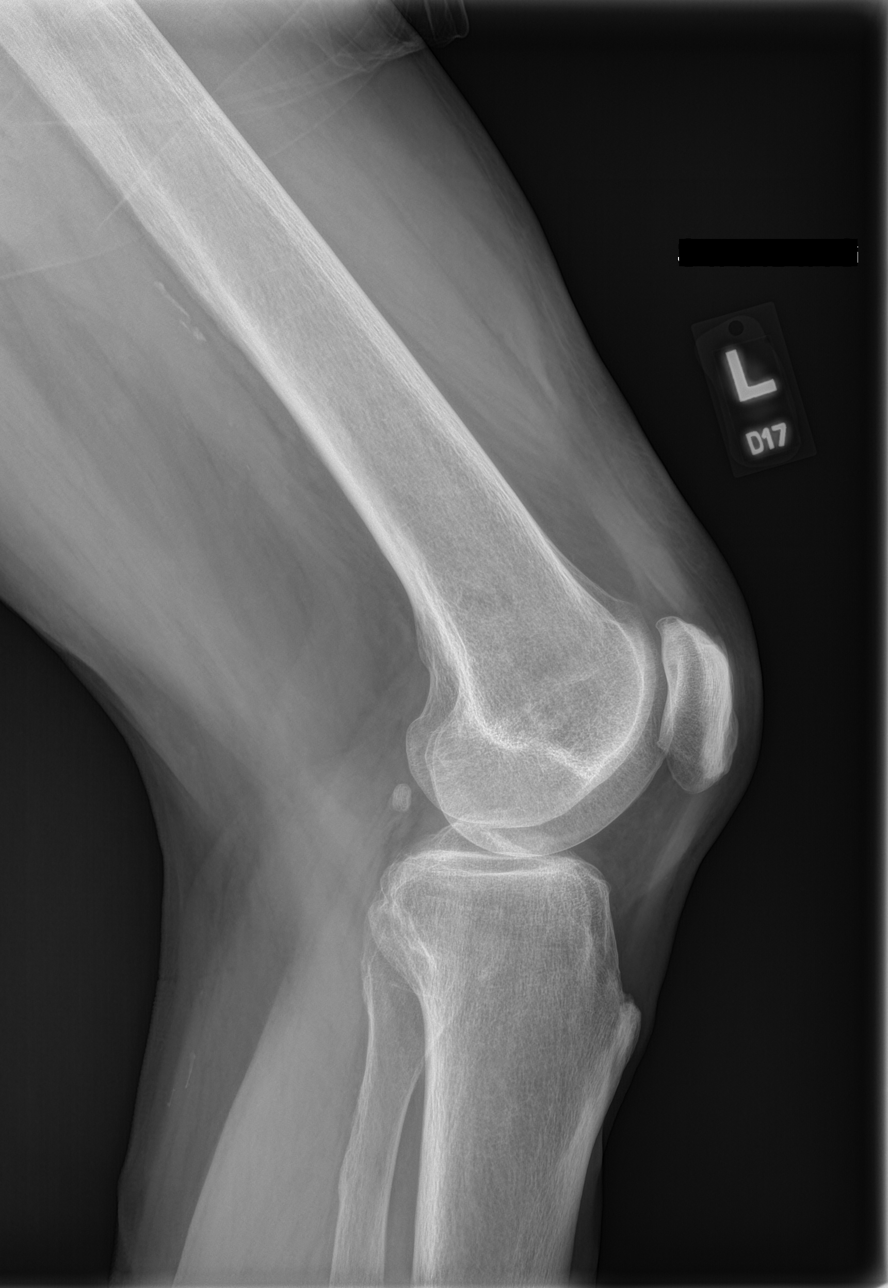
[im 3/4]
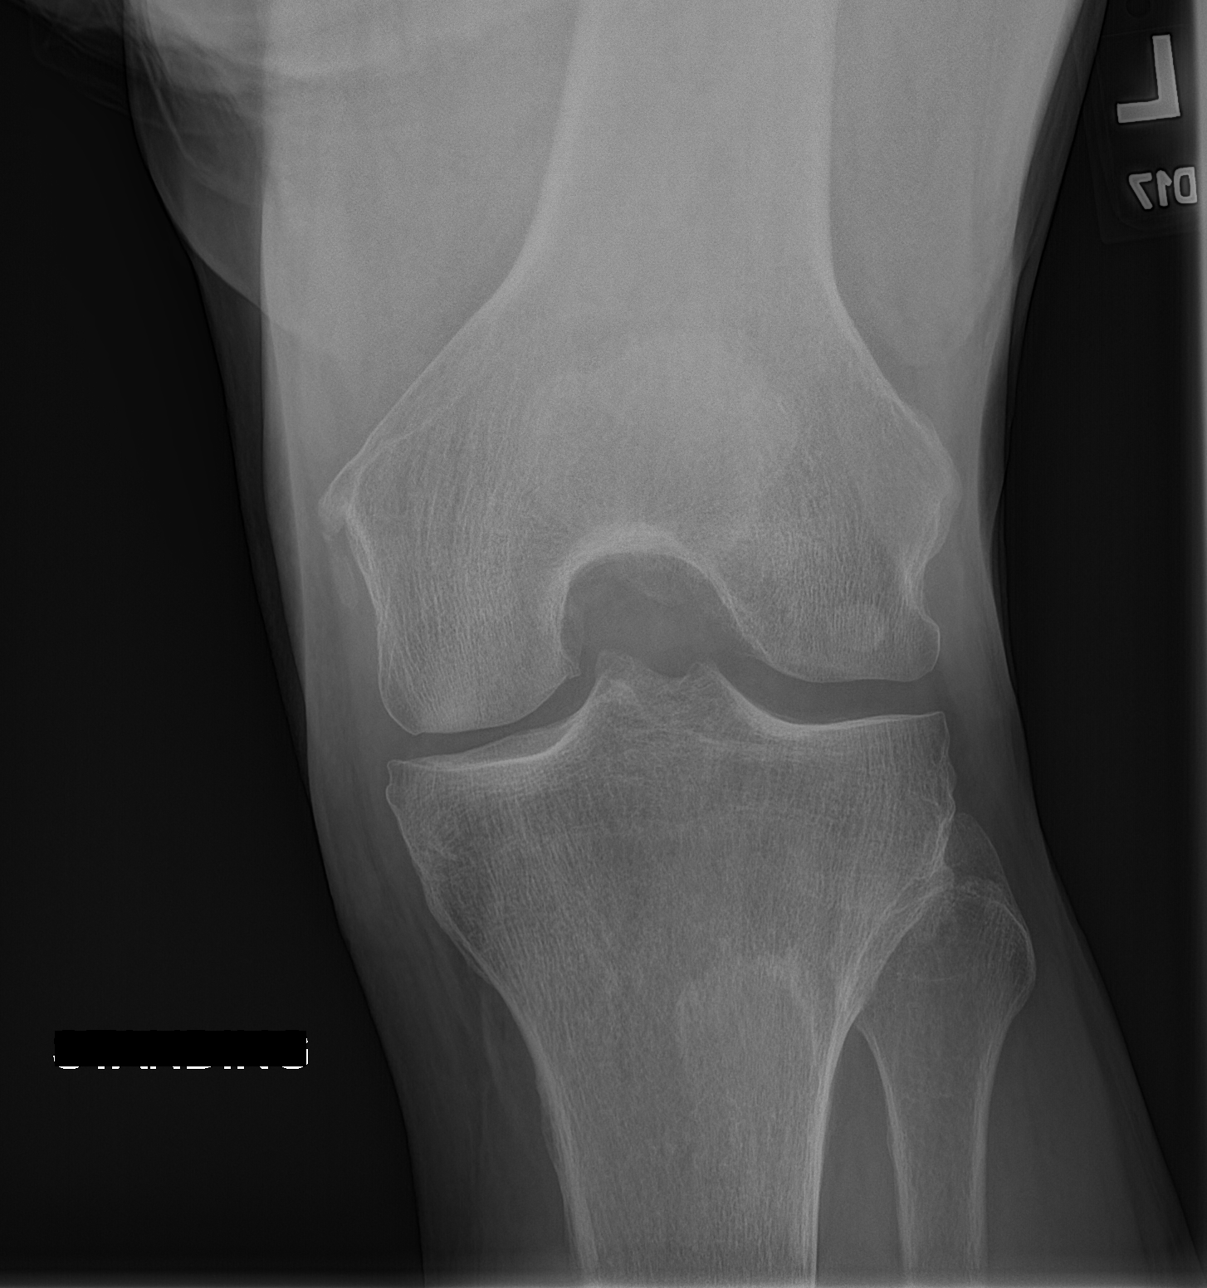
[im 4/4]
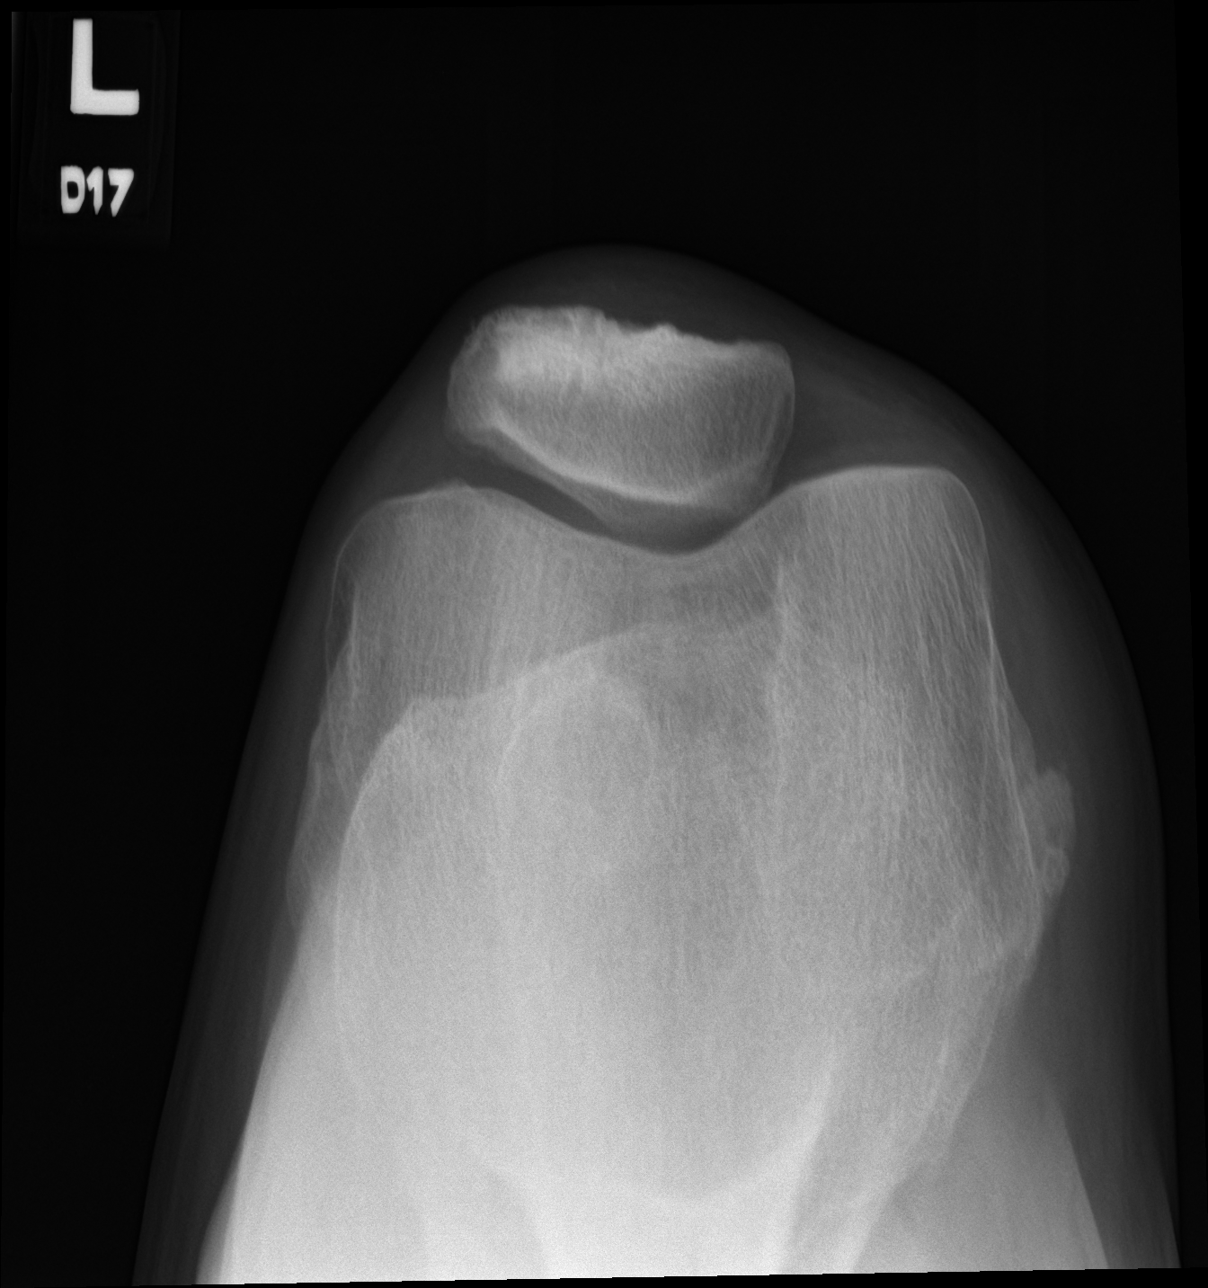

[4 of 4 positions shown; findings below may reference images not displayed]

FINDINGS: No evidence of fracture, dislocation, or joint effusion. Mild
narrowing of the medial and lateral joint spaces are noted. Soft
tissues are unremarkable.
IMPRESSION: Mild degenerative joint disease. No acute abnormality seen in the
left knee.

## 2020-12-19 ENCOUNTER — Other Ambulatory Visit: Payer: Self-pay

## 2020-12-19 DIAGNOSIS — F429 Obsessive-compulsive disorder, unspecified: Secondary | ICD-10-CM

## 2020-12-19 DIAGNOSIS — F329 Major depressive disorder, single episode, unspecified: Secondary | ICD-10-CM

## 2020-12-20 MED ORDER — SERTRALINE HCL 100 MG PO TABS: 200.0000 mg | ORAL_TABLET | Freq: Every day | ORAL | 0 refills | Status: AC

## 2020-12-20 NOTE — Telephone Encounter (Signed)
Forwarded note to [place].

## 2020-12-20 NOTE — Telephone Encounter (Signed)
Pt is seeing a new provider in [place].

## 2020-12-20 NOTE — Telephone Encounter (Signed)
Noted  

## 2020-12-22 ENCOUNTER — Ambulatory Visit: Admit: 2020-12-22 | Discharge: 2020-12-22 | Payer: MEDICARE | Attending: Surgery | Primary: Family Medicine

## 2020-12-22 DIAGNOSIS — R195 Other fecal abnormalities: Secondary | ICD-10-CM

## 2020-12-22 NOTE — Progress Notes (Signed)
General Surgery New Patient Office Note:    12/22/2020    Terry, Wise  MRN: [id number]      CHIEF COMPLAINT: Positive occult blood    PRIMARY CARE PHYSICIAN: Pincus Sanes, MD    HISTORY: Terry Wise is a 65 y.o. year old here to discuss colonoscopy for:  Occult blood in stool.  Pt's history is notable for chronic constipation and he has noticed some blood when he wipes as well as in his stool.  He states that since he has been hydrating better his bowel movements have been more regular and he has not noticed as much blood in the stool..  Last study: Approximately 8 years ago which was normal. There is not known colon cancer/adenomatous polyps in the family (see family history).      REVIEW OF SYSTEMS: 10 Point ROS negative except what is in HPI      Past Medical History:   Diagnosis Date    Asthma     Hyperlipidemia     Hypertension        Current Outpatient Medications   Medication Sig Dispense Refill    Semaglutide, 1 MG/DOSE, 4 MG/3ML SOPN 1 mg every week as directed 3 mL 5    levothyroxine (SYNTHROID) 50 MCG tablet Take 1 tablet by mouth daily 90 tablet 1    cyclobenzaprine (FLEXERIL) 10 MG tablet       docusate (COLACE, DULCOLAX) 100 MG CAPS Take 100 mg by mouth 2 times daily      BREO ELLIPTA 100-25 MCG/INH AEPB inhaler INHALE 1 PUFF INTO THE LUNGS DAILY      gabapentin (NEURONTIN) 300 MG capsule Take 600 mg by mouth.      blood glucose test strips (CONTOUR NEXT TEST) strip CHECK BLOOD SUGAR DAILY      CONTOUR NEXT TEST strip CHECK BLOOD SUGAR DAILY      HYDROmorphone (DILAUDID) 4 MG tablet Take 2 mg by mouth every 4 hours as needed.      insulin glargine (LANTUS SOLOSTAR) 100 UNIT/ML injection pen ADMINISTER 30 UNITS UNDER THE SKIN DAILY      melatonin 1 MG tablet Take 1 mg by mouth nightly      naloxone 4 MG/0.1ML LIQD nasal spray 4 mg as needed      omeprazole (PRILOSEC) 20 MG delayed release capsule       atorvastatin (LIPITOR) 80 MG tablet Once daily 90 tablet 5    losartan (COZAAR) 50 MG  tablet Once daily 90 tablet 5    metFORMIN (GLUCOPHAGE) 1000 MG tablet 1 twice a day 180 tablet 5    chlorthalidone (HYGROTON) 25 MG tablet Take 1 tablet by mouth daily 90 tablet 5    Multiple Vitamins-Minerals (MULTIVITAMIN WITH MINERALS) tablet Once daily OTC 100 tablet 3    sertraline (ZOLOFT) 100 MG tablet Take 1 tablet by mouth daily 90 tablet 5     No current facility-administered medications for this visit.       Family History   Problem Relation Age of Onset    No Known Problems Mother     No Known Problems Father        Social History     Socioeconomic History    Marital status: Married     Spouse name: None    Number of children: None    Years of education: None    Highest education level: None   Tobacco Use    Smoking status: Never  Smokeless tobacco: Never   Substance and Sexual Activity    Alcohol use: Never     Social Determinants of Health     Financial Resource Strain: Low Risk     Difficulty of Paying Living Expenses: Not hard at all   Food Insecurity: No Food Insecurity    Worried About Programme researcher, broadcasting/film/video in the Last Year: Never true    Ran Out of Food in the Last Year: Never true         PHYSICAL EXAMINATION:    BP (!) 146/94    Pulse (!) 110    Ht 5\' 10"  (1.778 m)    Wt 225 lb 3.2 oz (102.2 kg)    SpO2 98%    BMI 32.31 kg/m??     General Appearance AOx3, in no acute distress  Eyes Conjunctivae/corneas clear. PERRL, EOM's intact. No scleral icterus  Ears, Nose, Throat ENT exam normal, no neck nodes or sinus tenderness  Neck supple, no significant adenopathy. No notable JVD  Respiratory No chest wall deformities or tenderness, respiratory effort normal, no use of accessory muscles.   Cardiovascular RRR. No chest wall tenderness.   Gastrointestinal Abdomen soft, nontender, nondistended. BS x4. No rebound, guarding, or rigidity present. No palpable masses. No CVA tenderness  Lymphatics No palpable lymphadenopathy, no hepatosplenomegaly  Musculoskeletal No joint tenderness, deformity or swelling.  Full ROM UE/LE. Distal pulses intact UE/LE. No edema, cyanosis, or venous stasis changes.  Skin Normal coloration and turgor, no rashes, no suspicious skin lesions noted  Neurological alert, oriented, normal speech, no focal findings or movement disorder noted, CN II-XII intact  Psychiatric Alert and oriented, appropriate affect    IMPRESSION:   Colonoscopy    PLAN:  Risks of endoscopy reviewed including but not limited to bleeding, perforation, missed lesion and incomplete exam.  All questions answered.   Discussed with the patient they are to be on a CLD the day prior to procedure and NPO @ [facility]. Discussed the bowel prep with the patient.

## 2020-12-22 NOTE — H&P (View-Only) (Signed)
General Surgery New Patient Office Note:    12/22/2020    Terry Wise, Terry Wise  MRN: [id number]      CHIEF COMPLAINT: Positive occult blood    PRIMARY CARE PHYSICIAN: Pincus Sanes, MD    HISTORY: Terry Wise is a 65 y.o. year old here to discuss colonoscopy for:  Occult blood in stool.  Pt's history is notable for chronic constipation and he has noticed some blood when he wipes as well as in his stool.  He states that since he has been hydrating better his bowel movements have been more regular and he has not noticed as much blood in the stool..  Last study: Approximately 8 years ago which was normal. There is not known colon cancer/adenomatous polyps in the family (see family history).      REVIEW OF SYSTEMS: 10 Point ROS negative except what is in HPI      Past Medical History:   Diagnosis Date    Asthma     Hyperlipidemia     Hypertension        Current Outpatient Medications   Medication Sig Dispense Refill    Semaglutide, 1 MG/DOSE, 4 MG/3ML SOPN 1 mg every week as directed 3 mL 5    levothyroxine (SYNTHROID) 50 MCG tablet Take 1 tablet by mouth daily 90 tablet 1    cyclobenzaprine (FLEXERIL) 10 MG tablet       docusate (COLACE, DULCOLAX) 100 MG CAPS Take 100 mg by mouth 2 times daily      BREO ELLIPTA 100-25 MCG/INH AEPB inhaler INHALE 1 PUFF INTO THE LUNGS DAILY      gabapentin (NEURONTIN) 300 MG capsule Take 600 mg by mouth.      blood glucose test strips (CONTOUR NEXT TEST) strip CHECK BLOOD SUGAR DAILY      CONTOUR NEXT TEST strip CHECK BLOOD SUGAR DAILY      HYDROmorphone (DILAUDID) 4 MG tablet Take 2 mg by mouth every 4 hours as needed.      insulin glargine (LANTUS SOLOSTAR) 100 UNIT/ML injection pen ADMINISTER 30 UNITS UNDER THE SKIN DAILY      melatonin 1 MG tablet Take 1 mg by mouth nightly      naloxone 4 MG/0.1ML LIQD nasal spray 4 mg as needed      omeprazole (PRILOSEC) 20 MG delayed release capsule       atorvastatin (LIPITOR) 80 MG tablet Once daily 90 tablet 5    losartan (COZAAR) 50 MG  tablet Once daily 90 tablet 5    metFORMIN (GLUCOPHAGE) 1000 MG tablet 1 twice a day 180 tablet 5    chlorthalidone (HYGROTON) 25 MG tablet Take 1 tablet by mouth daily 90 tablet 5    Multiple Vitamins-Minerals (MULTIVITAMIN WITH MINERALS) tablet Once daily OTC 100 tablet 3    sertraline (ZOLOFT) 100 MG tablet Take 1 tablet by mouth daily 90 tablet 5     No current facility-administered medications for this visit.       Family History   Problem Relation Age of Onset    No Known Problems Mother     No Known Problems Father        Social History     Socioeconomic History    Marital status: Married     Spouse name: None    Number of children: None    Years of education: None    Highest education level: None   Tobacco Use    Smoking status: Never  Smokeless tobacco: Never   Substance and Sexual Activity    Alcohol use: Never     Social Determinants of Health     Financial Resource Strain: Low Risk     Difficulty of Paying Living Expenses: Not hard at all   Food Insecurity: No Food Insecurity    Worried About Programme researcher, broadcasting/film/video in the Last Year: Never true    Ran Out of Food in the Last Year: Never true         PHYSICAL EXAMINATION:    BP (!) 146/94    Pulse (!) 110    Ht 5\' 10"  (1.778 m)    Wt 225 lb 3.2 oz (102.2 kg)    SpO2 98%    BMI 32.31 kg/m??     General Appearance AOx3, in no acute distress  Eyes Conjunctivae/corneas clear. PERRL, EOM's intact. No scleral icterus  Ears, Nose, Throat ENT exam normal, no neck nodes or sinus tenderness  Neck supple, no significant adenopathy. No notable JVD  Respiratory No chest wall deformities or tenderness, respiratory effort normal, no use of accessory muscles.   Cardiovascular RRR. No chest wall tenderness.   Gastrointestinal Abdomen soft, nontender, nondistended. BS x4. No rebound, guarding, or rigidity present. No palpable masses. No CVA tenderness  Lymphatics No palpable lymphadenopathy, no hepatosplenomegaly  Musculoskeletal No joint tenderness, deformity or swelling.  Full ROM UE/LE. Distal pulses intact UE/LE. No edema, cyanosis, or venous stasis changes.  Skin Normal coloration and turgor, no rashes, no suspicious skin lesions noted  Neurological alert, oriented, normal speech, no focal findings or movement disorder noted, CN II-XII intact  Psychiatric Alert and oriented, appropriate affect    IMPRESSION:   Colonoscopy    PLAN:  Risks of endoscopy reviewed including but not limited to bleeding, perforation, missed lesion and incomplete exam.  All questions answered.   Discussed with the patient they are to be on a CLD the day prior to procedure and NPO @ [facility]. Discussed the bowel prep with the patient.

## 2021-01-16 NOTE — Other (Signed)
Patient verified name, DOB, and procedure.    Type: 1a; abbreviated assessment per anesthesia guidelines    Labs per anesthesia: poc glucose    Instructed pt that they will be notified the day before their procedure by the GI Lab for time of arrival if their procedure is [place] and Pre-op for [facility] cases. Arrival times should be called by 5 pm. If no phone is received the patient should contact their respective hospital. The GI lab telephone number is [phone]   Follow diet and prep instructions per office including NPO status.  If patient has NOT received instructions from office patient is advised to call surgeon office, verbalizes understanding.    Bath or shower the night before and the am of surgery with non-mositurizing soap. No lotions, oils, powders, cologne on skin. No make up, eye make up or jewelry. Wear loose fitting comfortable, clean clothing.     Must have adult present in building the entire time .     Medications for the day of procedure flexeril, gabapentin, dilaudid if needed, synthroid, Prilosec and zoloft, patient to hold chlorthalidone, metformin, and losartan. Lantus-only take 80% of normal dose=24 units    The following discharge instructions reviewed with patient: medication given during procedure may cause drowsiness for several hours, therefore, do not drive or operate machinery for remainder of the day. You may not drink alcohol on the day of your procedure, please resume regular diet and activity unless otherwise directed. You may experience abdominal distention for several hours that is relieved by the passage of gas. Contact your physician if you have any of the following: fever or chills, severe abdominal pain or excessive amount of bleeding or a large amount when having a bowel movement. Occasional specks of blood with bowel movement would not be unusual.

## 2021-01-16 NOTE — Progress Notes (Signed)
Unable to reach patient to conifmr procedure time 1352 with arrival time 1230 for tomorrow. Voicemail box is full and cannot leave message

## 2021-01-17 ENCOUNTER — Inpatient Hospital Stay: Payer: MEDICARE

## 2021-01-17 LAB — POCT GLUCOSE: POC Glucose: 210 mg/dL — ABNORMAL HIGH (ref 65–100)

## 2021-01-17 MED ORDER — NORMAL SALINE FLUSH 0.9 % IV SOLN
0.9 % | INTRAVENOUS | Status: DC | PRN
Start: 2021-01-17 — End: 2021-01-17

## 2021-01-17 MED ORDER — SODIUM CHLORIDE 0.9 % IV SOLN
0.9 % | INTRAVENOUS | Status: DC | PRN
Start: 2021-01-17 — End: 2021-01-17

## 2021-01-17 MED ORDER — NORMAL SALINE FLUSH 0.9 % IV SOLN
0.9 % | Freq: Two times a day (BID) | INTRAVENOUS | Status: DC
Start: 2021-01-17 — End: 2021-01-17

## 2021-01-17 MED ORDER — GLYCOPYRROLATE 0.4 MG/2ML IJ SOLN
0.42 MG/2ML | INTRAMUSCULAR | Status: DC | PRN
Start: 2021-01-17 — End: 2021-01-17
  Administered 2021-01-17: 18:00:00 .2 via INTRAVENOUS

## 2021-01-17 MED ORDER — LIDOCAINE HCL (PF) 2 % IJ SOLN
2 % | INTRAMUSCULAR | Status: DC | PRN
Start: 2021-01-17 — End: 2021-01-17
  Administered 2021-01-17: 18:00:00 100 via INTRAVENOUS

## 2021-01-17 MED ORDER — LACTATED RINGERS IV SOLN
INTRAVENOUS | Status: DC
Start: 2021-01-17 — End: 2021-01-17
  Administered 2021-01-17: 18:00:00 via INTRAVENOUS

## 2021-01-17 MED ORDER — PROPOFOL 200 MG/20ML IV EMUL
20020 MG/20ML | INTRAVENOUS | Status: DC | PRN
Start: 2021-01-17 — End: 2021-01-17
  Administered 2021-01-17: 18:00:00 100 via INTRAVENOUS
  Administered 2021-01-17: 18:00:00 150 via INTRAVENOUS

## 2021-01-17 NOTE — Progress Notes (Signed)
BS 210 in prep.

## 2021-01-17 NOTE — Discharge Instructions (Signed)
Gastrointestinal Colonoscopy/Flexible Sigmoidoscopy - Lower Exam Discharge Instructions  Call Dr. Saldana at [phone] for any problems or questions.  Contact the doctor???s office for follow up appointment as directed  Medication may cause drowsiness for several hours, therefore, do not drive or operate machinery for remainder of the day.  No alcohol today.  Ordinarily, you may resume regular diet and activity after exam unless otherwise specified by your physician.  Because of air put into your colon during exam, you may experience some abdominal distension, relieved by the passage of gas, for several hours.  Contact your physician if you have any of the following:  Excessive amount of bleeding - large amount when having a bowel movement.  Occasional specks of blood with bowel movement would not be unusual.  Severe abdominal pain  Fever or Chills
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
## 2021-01-17 NOTE — Interval H&P Note (Signed)
Update History & Physical    The patient's History and Physical of December 22, 2020 was reviewed with the patient and I examined the patient. There was no change. The surgical site was confirmed by the patient and me.     Plan: The risks, benefits, expected outcome, and alternative to the recommended procedure have been discussed with the patient. Patient understands and wants to proceed with the procedure.     Electronically signed by Hillis Range, DO on 01/17/2021 at 12:31 PM

## 2021-01-17 NOTE — Progress Notes (Addendum)
1345-Discharge instructions were reviewed with patient and pts spouse. An opportunity was given for questions. Patient and spouse verbalized understanding, and has no questions at this time.    1400-To lobby via wheelchair accompanied by staff. Discharged to home in good condition via private vehicle.

## 2021-01-17 NOTE — Op Note (Signed)
COLONOSCOPY PROCEDURE NOTE    Name:  Terry Wise DOB: [birth year]  Date/Time of Admission: 01/17/2021 12:10 PM   MRN: [id number] PJA:[id number] Attending Provider: Dyke Brackett, *  Room/Bed:  ENDO/PL PCP: Grier Mitts, MD    Indications: The patient is being evaluated for blood in stool    The patient was given informed consent with the indications and complications of the procedure.  Consent form was signed.                                     Procedure: Sigmoidoscopy    Pre-operative Diagnosis: hem occult +    Post-operative Diagnosis: poor prep    Surgeon: Dyke Brackett, *    Anesthesia: MAC    Procedure: Informed consent was obtained for the procedure, including sedation.  Risks of perforation, hemorrhage, adverse drug reaction and aspiration were discussed. The patient was placed in the left lateral decubitus position.  Based on the pre-procedure assessment, including review of the patient's medical history, medications, allergies, and review of systems, Patient had been deemed to be an appropriate candidate for conscious sedation; Patient was therefore sedated by anesthesia.   The patient was monitored continuously with ECG tracing, pulse oximetry, blood pressure monitoring, and direct observations. Once adequately sedated a digital rectal exam was performed and was unremarkable.  The lubricated flexible fiberoptic colonoscope was then inserted through the anus into the rectum.   Under direct visualization with the use of air insufflation, the scope was advanced through the sigmoid which had formed stool and therefore I did not proceed forward.  Bowel prep was poor.  The patient tolerated procedure well without complications. Patient brought back to recovery room in satisfactory condition.    RECOMMENDATIONS: repeat prep    This recommendation is based on current guidelines and will prevent most but not all colon cancer cases.     Electronically Signed by: Dyke Brackett, DO  01/17/2021     ASGE Polypectomy Surveillance Guidelines    10 years Hyperplastic Polyp(s)    5 years 1-2 Tubular Adenoma(s) (<10 mm)    Serrated Polyp (<10 mm) without Dysplasia    3 years 3-10 Tubular Adenomas    One or more Tubular Adenoma(s) (>10 mm)    One or more Villous Adenoma(s)    Adenoma with High Grade Dysplasia    Sessile Serrated Polyp (>10 mm)    Sessile Serrated Polyp with Dysplasia    Traditional Serrated Adenoma    <3 years More than 10 Adenomas

## 2021-01-17 NOTE — Anesthesia Post-Procedure Evaluation (Signed)
Department of Anesthesiology  Postprocedure Note    Patient: Terry Wise  MRN: [id number]  Birthdate: 1955-11-25  Date of evaluation: 01/17/2021      Procedure Summary     Date: 01/17/21 Room / Location: [facility] 03 / [facility]    Anesthesia Start: 1248 Anesthesia Stop: 1307    Procedure: COLORECTAL CANCER SCREENING, NOT HIGH RISK Diagnosis:       Screening for colon cancer      (Screening for colon cancer [Z12.11])    Surgeons: Hillis Range, DO Responsible Provider: Barbaraann Faster, MD    Anesthesia Type: TIVA ASA Status: 3          Anesthesia Type: No value filed.    Aldrete Phase I: Aldrete Score: 10    Aldrete Phase II:        Anesthesia Post Evaluation    Patient location during evaluation: PACU  Patient participation: complete - patient participated  Level of consciousness: awake and alert  Airway patency: patent  Nausea & Vomiting: no nausea and no vomiting  Complications: no  Cardiovascular status: hemodynamically stable  Respiratory status: acceptable, nonlabored ventilation and spontaneous ventilation  Hydration status: euvolemic  Comments: BP 123/76    Pulse 92    Temp 97.8 ??F (36.6 ??C) (Temporal)    Resp 18    Ht 6' (1.829 m)    Wt 222 lb (100.7 kg)    SpO2 92%    BMI 30.11 kg/m??     Multimodal analgesia pain management approach

## 2021-01-24 ENCOUNTER — Encounter: Payer: MEDICARE | Attending: Family Medicine | Primary: Family Medicine

## 2021-02-27 ENCOUNTER — Ambulatory Visit: Admit: 2021-02-27 | Discharge: 2021-02-27 | Payer: MEDICARE | Attending: Family Medicine | Primary: Family Medicine

## 2021-02-27 DIAGNOSIS — E1165 Type 2 diabetes mellitus with hyperglycemia: Secondary | ICD-10-CM

## 2021-02-27 MED ORDER — CHLORTHALIDONE 25 MG PO TABS
25 MG | ORAL_TABLET | Freq: Every day | ORAL | 5 refills | Status: AC
Start: 2021-02-27 — End: ?

## 2021-02-27 MED ORDER — METFORMIN HCL 1000 MG PO TABS
1000 MG | ORAL_TABLET | ORAL | 5 refills | Status: AC
Start: 2021-02-27 — End: ?

## 2021-02-27 MED ORDER — CYCLOBENZAPRINE HCL 10 MG PO TABS
10 MG | ORAL_TABLET | ORAL | 1 refills | Status: AC
Start: 2021-02-27 — End: ?

## 2021-02-27 MED ORDER — ATORVASTATIN CALCIUM 80 MG PO TABS
80 MG | ORAL_TABLET | ORAL | 5 refills | Status: AC
Start: 2021-02-27 — End: ?

## 2021-02-27 MED ORDER — MULTI-VITAMIN/MINERALS PO TABS
ORAL_TABLET | ORAL | 3 refills | Status: AC
Start: 2021-02-27 — End: ?

## 2021-02-27 MED ORDER — LEVOTHYROXINE SODIUM 75 MCG PO TABS
75 MCG | ORAL_TABLET | Freq: Every day | ORAL | 5 refills | Status: DC
Start: 2021-02-27 — End: 2021-04-04

## 2021-02-27 MED ORDER — GABAPENTIN 300 MG PO CAPS
300 MG | ORAL_CAPSULE | ORAL | 5 refills | Status: AC
Start: 2021-02-27 — End: 2022-03-07
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
## 2021-02-27 NOTE — Addendum Note (Signed)
Addended by: Greta Doom on: 02/27/2021 10:22 AM     Modules accepted: Orders

## 2021-02-27 NOTE — Patient Instructions (Signed)
Preventing Falls: Care Instructions  Overview     Getting around your home safely can be a challenge if you have injuries or health problems that make it easy for you to fall. Loose rugs and furniture in walkways are among the dangers for many older people who have problems walking or who have poor eyesight. People who have conditions such as arthritis, osteoporosis, or dementia also have to be careful not to fall.  You can make your home safer with a few simple measures.  Follow-up care is a key part of your treatment and safety. Be sure to make and go to all appointments, and call your doctor if you are having problems. It's also a good idea to know your test results and keep a list of the medicines you take.  How can you care for yourself at home?  Taking care of yourself  Exercise regularly to improve your strength, muscle tone, and balance. Walk if you can. Swimming may be a good choice if you cannot walk easily.  Have your vision and hearing checked each year or any time you notice a change. If you have trouble seeing and hearing, you might not be able to avoid objects and could lose your balance.  Know the side effects of the medicines you take. Ask your doctor or pharmacist whether the medicines you take can affect your balance. Sleeping pills or sedatives can affect your balance.  Limit the amount of alcohol you drink. Alcohol can impair your balance and other senses.  Ask your doctor whether calluses or corns on your feet need to be removed. If you wear loose-fitting shoes because of calluses or corns, you can lose your balance and fall.  Talk to your doctor if you have numbness in your feet.  You may get dizzy if you do not drink enough water. To prevent dehydration, drink plenty of fluids. Choose water and other clear liquids. If you have kidney, heart, or liver disease and have to limit fluids, talk with your doctor before you increase the amount of fluids you drink.  Preventing falls at  home  Remove raised doorway thresholds, throw rugs, and clutter. Repair loose carpet or raised areas in the floor.  Move furniture and electrical cords to keep them out of walking paths.  Use nonskid floor wax, and wipe up spills right away, especially on ceramic tile floors.  If you use a walker or cane, put rubber tips on it. If you use crutches, clean the bottoms of them regularly with an abrasive pad, such as steel wool.  Keep your house well lit, especially stairways, porches, and outside walkways. Use night-lights in areas such as hallways and bathrooms. Add extra light switches or use remote switches (such as switches that go on or off when you clap your hands) to make it easier to turn lights on if you have to get up during the night.  Install sturdy handrails on stairways.  Move items in your cabinets so that the things you use a lot are on the lower shelves (about waist level).  Keep a cordless phone and a flashlight with new batteries by your bed. If possible, put a phone in each of the main rooms of your house, or carry a cell phone in case you fall and cannot reach a phone. Or, you can wear a device around your neck or wrist. You push a button that sends a signal for help.  Wear low-heeled shoes that fit well and give your feet  good support. Use footwear with nonskid soles. Check the heels and soles of your shoes for wear. Repair or replace worn heels or soles.  Do not wear socks without shoes on smooth floors, such as wood.  Walk on the grass when the sidewalks are slippery. If you live in an area that gets snow and ice in the winter, sprinkle salt on slippery steps and sidewalks. Or ask a family member or friend to do this for you.  Preventing falls in the bath  Install grab bars and nonskid mats inside and outside your shower or tub and near the toilet and sinks.  Use shower chairs and bath benches.  Use a hand-held shower head that will allow you to sit while showering.  Get into a tub or shower by  putting the weaker leg in first. Get out of a tub or shower with your strong side first.  Repair loose toilet seats and consider installing a raised toilet seat to make getting on and off the toilet easier.  Keep your bathroom door unlocked while you are in the shower.  Where can you learn more?  Go to https://www.bennett.info/ and enter G117 to learn more about "Preventing Falls: Care Instructions."  Current as of: Jun 22, 2020??????????????????????????????Content Version: 13.5  ?? 2006-2022 Healthwise, Incorporated.   Care instructions adapted under license by [facility]. If you have questions about a medical condition or this instruction, always ask your healthcare professional. [place] any warranty or liability for your use of this information.           Learning About Being Active as an Older Adult  Why is being active important as you get older?     Being active is one of the best things you can do for your health. And it's never too late to start. Being active--or getting active, if you aren't already--has definite benefits. It can:  Give you more energy,  Keep your mind sharp.  Improve balance to reduce your risk of falls.  Help you manage chronic illness with fewer medicines.  No matter how old you are, how fit you are, or what health problems you have, there is a form of activity that will work for you. And the more physical activity you can do, the better your overall health will be.  What kinds of activity can help you stay healthy?  Being more active will make your daily activities easier. Physical activity includes planned exercise and things you do in daily life. There are four types of activity:  Aerobic.  Doing aerobic activity makes your heart and lungs strong.  Includes walking, dancing, and gardening.  Aim for at least 2?? hours spread throughout the week.  It improves your energy and can help you sleep better.  Muscle-strengthening.  This type of activity can help maintain  muscle and strengthen bones.  Includes climbing stairs, using resistance bands, and lifting or carrying heavy loads.  Aim for at least twice a week.  It can help protect the knees and other joints.  Stretching.  Stretching gives you better range of motion in joints and muscles.  Includes upper arm stretches, calf stretches, and gentle yoga.  Aim for at least twice a week, preferably after your muscles are warmed up from other activities.  It can help you function better in daily life.  Balancing.  This helps you stay coordinated and have good posture.  Includes heel-to-toe walking, tai chi, and certain types of yoga.  Aim for at  least 3 days a week.  It can reduce your risk of falling.  Even if you have a hard time meeting the recommendations, it's better to be more active than less active. All activity done in each category counts toward your weekly total. You'd be surprised how daily things like carrying groceries, keeping up with grandchildren, and taking the stairs can add up.  What keeps you from being active?  If you've had a hard time being more active, you're not alone. Maybe you remember being able to do more. Or maybe you've never thought of yourself as being active. It's frustrating when you can't do the things you want. Being more active can help. What's holding you back?  Getting started.  Have a goal, but break it into easy tasks. Small steps build into big accomplishments.  Staying motivated.  If you feel like skipping your activity, remember your goal. Maybe you want to move better and stay independent. Every activity gets you one step closer.  Not feeling your best.  Start with 5 minutes of an activity you enjoy. Prove to yourself you can do it. As you get comfortable, increase your time.  You may not be where you want to be. But you're in the process of getting there. Everyone starts somewhere.  How can you find safe ways to stay active?  Talk with your doctor about any physical challenges you're  facing. Make a plan with your doctor if you have a health problem or aren't sure how to get started with activity.  If you're already active, ask your doctor if there is anything you should change to stay safe as your body and health change.  If you tend to feel dizzy after you take medicine, avoid activity at that time. Try being active before you take your medicine. This will reduce your risk of falls.  If you plan to be active at home, make sure to clear your space before you get started. Remove things like TV cords, coffee tables, and throw rugs. It's safest to have plenty of space to move freely.  The key to getting more active is to take it slow and steady. Try to improve only a little bit at a time. Pick just one area to improve on at first. And if an activity hurts, stop and talk to your doctor.  Where can you learn more?  Go to https://www.bennett.info/ and enter P600 to learn more about "Learning About Being Active as an Older Adult."  Current as of: November 28, 2020??????????????????????????????Content Version: 13.5  ?? 2006-2022 Healthwise, Incorporated.   Care instructions adapted under license by [facility]. If you have questions about a medical condition or this instruction, always ask your healthcare professional. [place] any warranty or liability for your use of this information.           Learning About Dental Care for Older Adults  Dental care for older adults: Overview  Dental care for older people is much the same as for younger adults. But older adults do have concerns that younger adults do not. Older adults may have problems with gum disease and decay on the roots of their teeth. They may need missing teeth replaced or broken fillings fixed. Or they may have dentures that need to be cared for. Some older adults may have trouble holding a toothbrush.  You can help remind the person you are caring for to brush and floss their teeth or to clean their dentures. In some  cases,  you may need to do the brushing and other dental care tasks. People who have trouble using their hands or who have dementia may need this extra help.  How can you help with dental care?  Normal dental care  To keep the teeth and gums healthy:  Brush the teeth with fluoride toothpaste twice a day--in the morning and at night--and floss at least once a day. Plaque can quickly build up on the teeth of older adults.  Watch for the signs of gum disease. These signs include gums that bleed after brushing or after eating hard foods, such as apples.  See a dentist regularly. Many experts recommend checkups every 6 months.  Keep the dentist up to date on any new medications the person is taking.  Encourage a balanced diet that includes whole grains, vegetables, and fruits, and that is low in saturated fat and sodium.  Encourage the person you're caring for not to use tobacco products. They can affect dental and general health.  Many older adults have a fixed income and feel that they can't afford dental care. But most towns and cities have programs in which dentists help older adults by lowering fees. Contact your area's public health offices or social services for information about dental care in your area.  Using a toothbrush  Older adults with arthritis sometimes have trouble brushing their teeth because they can't easily hold the toothbrush. Their hands and fingers may be stiff, painful, or weak. If this is the case, you can:  Offer an IT trainer toothbrush.  Enlarge the handle of a non-electric toothbrush by wrapping a sponge, an elastic bandage, or adhesive tape around it.  Push the toothbrush handle through a ball made of rubber or soft foam.  Make the handle longer and thicker by taping Popsicle sticks or tongue depressors to it.  You may also be able to buy special toothbrushes, toothpaste dispensers, and floss holders.  Your doctor may recommend a soft-bristle toothbrush if the person you care for bleeds  easily. Bleeding can happen because of a health problem or from certain medicines.  A toothpaste for sensitive teeth may help if the person you care for has sensitive teeth.  How do you brush and floss someone's teeth?  If the person you are caring for has a hard time cleaning their teeth on their own, you may need to brush and floss their teeth for them. It may be easiest to have the person sit and face away from you, and to sit or stand behind them. That way you can steady their head against your arm as you reach around to floss and brush their teeth. Choose a place that has good lighting and is comfortable for both of you.  Before you begin, gather your supplies. You will need gloves, floss, a toothbrush, and a container to hold water if you are not near a sink. Wash and dry your hands well and put on gloves. Start by flossing:  Gently work a piece of floss between each of the teeth toward the gums. A plastic flossing tool may make this easier, and they are available at most drugstores.  Curve the floss around each tooth into a U-shape and gently slide it under the gum line.  Move the floss firmly up and down several times to scrape off the plaque.  After you've finished flossing, throw away the used floss and begin brushing:  Wet the brush and apply toothpaste.  Place the brush at a 45-degree angle where  the teeth meet the gums. Press firmly, and move the brush in small circles over the surface of the teeth.  Be careful not to brush too hard. Vigorous brushing can make the gums pull away from the teeth and can scratch the tooth enamel.  Brush all surfaces of the teeth, on the tongue side and on the cheek side. Pay special attention to the front teeth and all surfaces of the back teeth.  Brush chewing surfaces with short back-and-forth strokes.  After you've finished, help the person rinse the remaining toothpaste from their mouth.  Where can you learn more?  Go to https://www.bennett.info/ and enter  F944 to learn more about "[place] for Older Adults."  Current as of: August 04, 2020??????????????????????????????Content Version: 13.5  ?? 2006-2022 Healthwise, Incorporated.   Care instructions adapted under license by [facility]. If you have questions about a medical condition or this instruction, always ask your healthcare professional. [place] any warranty or liability for your use of this information.           Hearing Loss: Care Instructions  Overview     Hearing loss is a sudden or slow decrease in how well you hear. It can range from mild to severe. Permanent hearing loss can occur with aging. It also can happen when you are exposed long-term to loud noise. Examples include listening to loud music, riding motorcycles, or being around other loud machines.  Hearing loss can affect your work and home life. It can make you feel lonely or depressed. You may feel that you have lost your independence. But hearing aids and other devices can help you hear better and feel connected to others.  Follow-up care is a key part of your treatment and safety. Be sure to make and go to all appointments, and call your doctor if you are having problems. It's also a good idea to know your test results and keep a list of the medicines you take.  How can you care for yourself at home?  Avoid loud noises whenever possible. This helps keep your hearing from getting worse.  Always wear hearing protection around loud noises.  Wear a hearing aid as directed. See a professional who can help you pick a hearing aid that fits you.  Have hearing tests as your doctor suggests. They can show whether your hearing has changed. Your hearing aid may need to be adjusted.  Use other devices as needed. These may include:  Telephone amplifiers and hearing aids that can connect to a television, stereo, radio, or microphone.  Devices that use lights or vibrations. These alert you to the doorbell, a ringing telephone, or a  baby monitor.  Television closed-captioning. This shows the words at the bottom of the screen. Most new TVs can do this.  TTY (text telephone). This lets you type messages back and forth on the telephone instead of talking or listening. These devices are also called TDD. When messages are typed on the keyboard, they are sent over the phone line to a receiving TTY. The message is shown on a monitor.  Use text messaging, social media, and email if it is hard for you to communicate by telephone.  Try to learn a listening technique called speechreading. It is not lipreading. You pay attention to people's gestures, expressions, posture, and tone of voice. These clues can help you understand what a person is saying. Face the person you are talking to, and have them face you. Make sure  the lighting is good. You need to see the other person's face clearly.  Think about counseling if you need help to adjust to your hearing loss.  When should you call for help?  Watch closely for changes in your health, and be sure to contact your doctor if:  ?? You think your hearing is getting worse.   ?? You have new symptoms, such as dizziness or nausea.   Where can you learn more?  Go to https://www.bennett.info/ and enter R798 to learn more about "Hearing Loss: Care Instructions."  Current as of: Jun 22, 2020??????????????????????????????Content Version: 13.5  ?? 2006-2022 Healthwise, Incorporated.   Care instructions adapted under license by [facility]. If you have questions about a medical condition or this instruction, always ask your healthcare professional. [place] any warranty or liability for your use of this information.           Learning About Vision Tests  What are vision tests?     The four most common vision tests are visual acuity tests, refraction, visual field tests, and color vision tests.  Visual acuity (sharpness) tests  These tests are used:  To see if you need glasses or contact lenses.  To  monitor an eye problem.  To check an eye injury.  Visual acuity tests are done as part of routine exams. You may also have this test when you get your driver's license or apply for some types of jobs.  Visual field tests  These tests are used:  To check for vision loss in any area of your range of vision.  To screen for certain eye diseases.  To look for nerve damage after a stroke, head injury, or other problem that could reduce blood flow to the brain.  Refraction and color tests  A refraction test is done to find the right prescription for glasses and contact lenses.  A color vision test is done to check for color blindness.  Color vision is often tested as part of a routine exam. You may also have this test when you apply for a job where recognizing different colors is important, such as truck driving, Research officer, trade union, or the [employer].  How are vision tests done?  Visual acuity test   You cover one eye at a time.  You read aloud from a wall chart across the room.  You read aloud from a small card that you hold in your hand.  Refraction   You look into a special device.  The device puts lenses of different strengths in front of each eye to see how strong your glasses or contact lenses need to be.  Visual field tests   Your doctor may have you look through special machines.  Or your doctor may simply have you stare straight ahead while they move a finger into and out of your field of vision.  Color vision test   You look at pieces of printed test patterns in various colors. You say what number or symbol you see.  Your doctor may have you trace the number or symbol using a pointer.  How do these tests feel?  There is very little chance of having a problem from this test. If dilating drops are used for a vision test, they may make the eyes sting and cause a medicine taste in the mouth.  Follow-up care is a key part of your treatment and safety. Be sure to make and go to all appointments, and call your doctor if you are  having problems. It's also a good idea to know your test results and keep a list of the medicines you take.  Where can you learn more?  Go to https://www.bennett.info/ and enter G551 to learn more about "Learning About Vision Tests."  Current as of: November 30, 2020??????????????????????????????Content Version: 13.5  ?? 2006-2022 Healthwise, Incorporated.   Care instructions adapted under license by [facility]. If you have questions about a medical condition or this instruction, always ask your healthcare professional. [place] any warranty or liability for your use of this information.           Advance Directives: Care Instructions  Overview  An advance directive is a legal way to state your wishes at the end of your life. It tells your family and your doctor what to do if you can't say what you want.  There are two main types of advance directives. You can change them any time your wishes change.  Living will.  This form tells your family and your doctor your wishes about life support and other treatment. The form is also called a declaration.  Medical power of attorney.  This form lets you name a person to make treatment decisions for you when you can't speak for yourself. This person is called a health care agent (health care proxy, health care surrogate). The form is also called a durable power of attorney for health care.  If you do not have an advance directive, decisions about your medical care may be made by a family member, or by a doctor or a judge who doesn't know you.  It may help to think of an advance directive as a gift to the people who care for you. If you have one, they won't have to make tough decisions by themselves.  For more information, including forms for your state, see the [place] website (Rebanking[employer].hu).  Follow-up care is a key part of your treatment and safety. Be sure to make and go to all appointments, and call your doctor  if you are having problems. It's also a good idea to know your test results and keep a list of the medicines you take.  What should you include in an advance directive?  Many states have a unique advance directive form. (It may ask you to address specific issues.) Or you might use a universal form that's approved by many states.  If your form doesn't tell you what to address, it may be hard to know what to include in your advance directive. Use the questions below to help you get started.  Who do you want to make decisions about your medical care if you are not able to?  What life-support measures do you want if you have a serious illness that gets worse over time or can't be cured?  What are you most afraid of that might happen? (Maybe you're afraid of having pain, losing your independence, or being kept alive by machines.)  Where would you prefer to die? (Your home? A hospital? A nursing home?)  Do you want to donate your organs when you die?  Do you want certain religious practices performed before you die?  When should you call for help?  Be sure to contact your doctor if you have any questions.  Where can you learn more?  Go to https://www.bennett.info/ and enter R264 to learn more about "Advance Directives: Care Instructions."  Current as of: August 04, 2020??????????????????????????????Content Version: 13.5  ?? 2006-2022 Healthwise, Incorporated.   Care  instructions adapted under license by [facility]. If you have questions about a medical condition or this instruction, always ask your healthcare professional. [place] any warranty or liability for your use of this information.      Personalized Preventive Plan for Terry Wise - 02/27/2021  Medicare offers a range of preventive health benefits. Some of the tests and screenings are paid in full while other may be subject to a deductible, co-insurance, and/or copay.    Some of these benefits include a comprehensive review of your  medical history including lifestyle, illnesses that may run in your family, and various assessments and screenings as appropriate.    After reviewing your medical record and screening and assessments performed today your provider may have ordered immunizations, labs, imaging, and/or referrals for you.  A list of these orders (if applicable) as well as your Preventive Care list are included within your After Visit Summary for your review.    Other Preventive Recommendations:    A preventive eye exam performed by an eye specialist is recommended every 1-2 years to screen for glaucoma; cataracts, macular degeneration, and other eye disorders.  A preventive dental visit is recommended every 6 months.  Try to get at least 150 minutes of exercise per week or 10,000 steps per day on a pedometer .  Order or download the FREE "Exercise & Physical Activity: Your Everyday Guide" from [employer]. Call [phone] or search [employer] online.  You need 1200-1500 mg of calcium and 1000-2000 IU of vitamin D per day. It is possible to meet your calcium requirement with diet alone, but a vitamin D supplement is usually necessary to meet this goal.  When exposed to the sun, use a sunscreen that protects against both UVA and UVB radiation with an SPF of 30 or greater. Reapply every 2 to 3 hours or after sweating, drying off with a towel, or swimming.  Always wear a seat belt when traveling in a car. Always wear a helmet when riding a bicycle or motorcycle.

## 2021-02-28 LAB — CBC WITH AUTO DIFFERENTIAL
Absolute Eos #: 0.2 10*3/uL (ref 0.0–0.8)
Absolute Immature Granulocyte: 0 10*3/uL (ref 0.0–0.5)
Absolute Lymph #: 6.3 10*3/uL — ABNORMAL HIGH (ref 0.5–4.6)
Absolute Mono #: 1.1 10*3/uL (ref 0.1–1.3)
Basophils Absolute: 0 10*3/uL (ref 0.0–0.2)
Basophils: 0 % (ref 0.0–2.0)
Eosinophils %: 1 % (ref 0.5–7.8)
Hematocrit: 49.8 % (ref 41.1–50.3)
Hemoglobin: 16.9 g/dL (ref 13.6–17.2)
Immature Granulocytes: 0 % (ref 0.0–5.0)
Lymphocytes: 39 % (ref 13–44)
MCH: 32.7 PG (ref 26.1–32.9)
MCHC: 33.9 g/dL (ref 31.4–35.0)
MCV: 96.3 FL (ref 82–102)
MPV: 10.8 FL (ref 9.4–12.3)
Monocytes: 7 % (ref 4.0–12.0)
Platelet Comment: INCREASED
Platelets: 493 10*3/uL — ABNORMAL HIGH (ref 150–450)
RBC: 5.17 M/uL (ref 4.23–5.6)
RDW: 12.8 % (ref 11.9–14.6)
Seg Neutrophils: 53 % (ref 43–78)
Segs Absolute: 8.6 10*3/uL — ABNORMAL HIGH (ref 1.7–8.2)
WBC: 16.2 10*3/uL — ABNORMAL HIGH (ref 4.3–11.1)
nRBC: 0 10*3/uL (ref 0.0–0.2)

## 2021-02-28 LAB — LIPID PANEL
Chol/HDL Ratio: 2.6
Cholesterol, Total: 129 MG/DL (ref <200)
HDL: 50 MG/DL (ref 40–60)
LDL Calculated: 56.4 MG/DL (ref <100)
Triglycerides: 113 MG/DL (ref 35–150)
VLDL Cholesterol Calculated: 22.6 MG/DL (ref 6.0–23.0)

## 2021-02-28 LAB — COMPREHENSIVE METABOLIC PANEL
ALT: 32 U/L (ref 12–65)
AST: 19 U/L (ref 15–37)
Albumin/Globulin Ratio: 1 (ref 0.4–1.6)
Albumin: 4.1 g/dL (ref 3.2–4.6)
Alk Phosphatase: 78 U/L (ref 50–136)
Anion Gap: 11 mmol/L (ref 2–11)
BUN: 30 MG/DL — ABNORMAL HIGH (ref 8–23)
CO2: 27 mmol/L (ref 21–32)
Calcium: 10.3 MG/DL (ref 8.3–10.4)
Chloride: 97 mmol/L — ABNORMAL LOW (ref 101–110)
Creatinine: 1.3 MG/DL (ref 0.8–1.5)
Est, Glom Filt Rate: >60 mL/min/{1.73_m2} (ref >60)
Globulin: 4.1 g/dL (ref 2.8–4.5)
Glucose: 138 mg/dL — ABNORMAL HIGH (ref 65–100)
Potassium: 3.7 mmol/L (ref 3.5–5.1)
Sodium: 135 mmol/L (ref 133–143)
Total Bilirubin: 0.4 MG/DL (ref 0.2–1.1)
Total Protein: 8.2 g/dL (ref 6.3–8.2)

## 2021-02-28 LAB — HEMOGLOBIN A1C
Hemoglobin A1C: 9 % — ABNORMAL HIGH (ref 4.8–5.6)
eAG: 212 mg/dL

## 2021-02-28 LAB — TSH: TSH, 3RD GENERATION: 3.8 u[IU]/mL — ABNORMAL HIGH (ref 0.358–3.740)

## 2021-04-03 ENCOUNTER — Encounter

## 2021-04-04 MED ORDER — LEVOTHYROXINE SODIUM 50 MCG PO TABS
50 MCG | ORAL_TABLET | Freq: Every day | ORAL | 3 refills | Status: AC
Start: 2021-04-04 — End: ?

## 2021-08-28 ENCOUNTER — Encounter: Payer: MEDICARE | Attending: Family Medicine | Primary: Family Medicine

## 2021-09-19 ENCOUNTER — Ambulatory Visit: Admit: 2021-09-19 | Discharge: 2021-09-19 | Payer: MEDICARE | Attending: Family Medicine | Primary: Family Medicine

## 2021-09-19 DIAGNOSIS — E1165 Type 2 diabetes mellitus with hyperglycemia: Secondary | ICD-10-CM

## 2021-09-19 MED ORDER — EMPAGLIFLOZIN 25 MG PO TABS
25 MG | ORAL_TABLET | Freq: Every day | ORAL | 3 refills | Status: AC
Start: 2021-09-19 — End: ?

## 2021-09-19 MED ORDER — OMEPRAZOLE 20 MG PO CPDR
20 MG | ORAL_CAPSULE | ORAL | 3 refills | Status: AC
Start: 2021-09-19 — End: ?
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
## 2021-09-19 NOTE — Patient Instructions (Addendum)
Medication as directed adding Jardiance or similar medication covered with by insurance with my current medications    Acetaminophen ibuprofen anti-inflammatory sparingly for pain use compression sleeve for knee pain.  Can refer to orthopedics for possibility of knee replacement.  Keep follow-up with pain management

## 2021-09-20 LAB — COMPREHENSIVE METABOLIC PANEL
ALT: 30 U/L (ref 12–65)
AST: 14 U/L — ABNORMAL LOW (ref 15–37)
Albumin/Globulin Ratio: 1.1 (ref 0.4–1.6)
Albumin: 4 g/dL (ref 3.2–4.6)
Alk Phosphatase: 74 U/L (ref 50–136)
Anion Gap: 10 mmol/L (ref 2–11)
BUN: 31 MG/DL — ABNORMAL HIGH (ref 8–23)
CO2: 25 mmol/L (ref 21–32)
Calcium: 10 MG/DL (ref 8.3–10.4)
Chloride: 103 mmol/L (ref 101–110)
Creatinine: 1.3 MG/DL (ref 0.8–1.5)
Est, Glom Filt Rate: >60 mL/min/{1.73_m2} (ref >60)
Globulin: 3.7 g/dL (ref 2.8–4.5)
Glucose: 234 mg/dL — ABNORMAL HIGH (ref 65–100)
Potassium: 4.3 mmol/L (ref 3.5–5.1)
Sodium: 138 mmol/L (ref 133–143)
Total Bilirubin: 0.3 MG/DL (ref 0.2–1.1)
Total Protein: 7.7 g/dL (ref 6.3–8.2)

## 2021-09-20 LAB — MICROALBUMIN / CREATININE URINE RATIO
Creatinine, Ur: 107 mg/dL
Microalbumin Creatinine Ratio: 23 mg/g (ref 0–30)
Microalbumin, Random Urine: 2.44 MG/DL

## 2021-09-20 LAB — CBC WITH AUTO DIFFERENTIAL
Absolute Immature Granulocyte: 0.1 10*3/uL (ref 0.0–0.5)
Basophils %: 0 % (ref 0.0–2.0)
Basophils Absolute: 0.1 10*3/uL (ref 0.0–0.2)
Eosinophils %: 0 % — ABNORMAL LOW (ref 0.5–7.8)
Eosinophils Absolute: 0.1 10*3/uL (ref 0.0–0.8)
Hematocrit: 46.6 % (ref 41.1–50.3)
Hemoglobin: 15.2 g/dL (ref 13.6–17.2)
Immature Granulocytes: 0 % (ref 0.0–5.0)
Lymphocytes %: 29 % (ref 13–44)
Lymphocytes Absolute: 5.3 10*3/uL — ABNORMAL HIGH (ref 0.5–4.6)
MCH: 31.5 PG (ref 26.1–32.9)
MCHC: 32.6 g/dL (ref 31.4–35.0)
MCV: 96.7 FL (ref 82–102)
MPV: 10.9 FL (ref 9.4–12.3)
Monocytes %: 6 % (ref 4.0–12.0)
Monocytes Absolute: 1.2 10*3/uL (ref 0.1–1.3)
Neutrophils %: 63 % (ref 43–78)
Neutrophils Absolute: 11.4 10*3/uL — ABNORMAL HIGH (ref 1.7–8.2)
Platelets: 497 10*3/uL — ABNORMAL HIGH (ref 150–450)
RBC: 4.82 M/uL (ref 4.23–5.6)
RDW: 13 % (ref 11.9–14.6)
WBC: 18.1 10*3/uL — ABNORMAL HIGH (ref 4.3–11.1)
nRBC: 0 10*3/uL (ref 0.0–0.2)

## 2021-09-20 LAB — HEMOGLOBIN A1C
Hemoglobin A1C: 9.2 % — ABNORMAL HIGH (ref 4.8–5.6)
eAG: 217 mg/dL

## 2021-09-20 LAB — TSH: TSH, 3RD GENERATION: 2.45 u[IU]/mL (ref 0.358–3.740)

## 2021-09-20 MED ORDER — INSULIN DETEMIR 100 UNIT/ML SC SOPN
100 UNIT/ML | SUBCUTANEOUS | 3 refills | Status: AC
Start: 2021-09-20 — End: ?

## 2021-09-20 NOTE — Addendum Note (Signed)
Addended by: Pincus Sanes on: 09/20/2021 08:42 AM     Modules accepted: Orders

## 2021-10-11 ENCOUNTER — Encounter

## 2021-10-17 NOTE — Progress Notes (Signed)
NEW PATIENT INTAKE    Referral Diagnosis: Leukocytosis, unspecified type; Weight loss    Referring Provider: Pincus Sanes, MD    Primary Care Provider: Pincus Sanes, MD    Family History of Cancer/ Hematology Disorders: None reported     Presenting Symptoms: Unintentional weight loss, abdominal pain, and arthralgias    Chronological History of Pertinent Events: Mr. Montez Morita is a 66 year old white male:     09/19/21: Pt presented to his PCP for routine f/u of chronic conditions  -ROS was significant for unintentional weight loss, abdominal pain, and arthralgias  -PCP notes that patient's significant weight loss is possibly related to poorly controlled DM  -Routine labs drawn the same day were significant for WBC 18.1, PLT 497, eosinophils 9, ANC 11.4, and abs lymphocytes 5.3 (Epic/Labs)  -Review of records indicates that pt's white count has been persistently elevated since at least October of 2019 ranging between 19.9 and 12.6 (Epic/Labs & CE/Labs)     09/20/21: Referred to [facility] for heme/onc evaluation and treatment of leukocytosis and weight loss    Notes from Referring Provider: None    Presented at Tumor Board: N/A    Other Pertinent Information: None

## 2021-10-18 ENCOUNTER — Ambulatory Visit
Admit: 2021-10-18 | Discharge: 2021-10-18 | Payer: MEDICARE | Attending: Hematology & Oncology | Primary: Family Medicine

## 2021-10-18 ENCOUNTER — Other Ambulatory Visit: Payer: MEDICARE | Primary: Family Medicine

## 2021-10-18 DIAGNOSIS — D72829 Elevated white blood cell count, unspecified: Secondary | ICD-10-CM

## 2021-10-18 LAB — CBC WITH AUTO DIFFERENTIAL
Absolute Immature Granulocyte: 0 10*3/uL (ref 0.0–0.5)
Basophils %: 1 % (ref 0.0–2.0)
Basophils Absolute: 0.2 10*3/uL (ref 0.0–0.2)
Eosinophils %: 1 % (ref 0.5–7.8)
Eosinophils Absolute: 0.2 10*3/uL (ref 0.0–0.8)
Hematocrit: 42.4 % (ref 41.1–50.3)
Hemoglobin: 14.5 g/dL (ref 13.6–17.2)
Immature Granulocytes: 0 % (ref 0.0–5.0)
Lymphocytes %: 42 % (ref 13–44)
Lymphocytes Absolute: 7.2 10*3/uL — ABNORMAL HIGH (ref 0.5–4.6)
MCH: 32.1 PG (ref 26.1–32.9)
MCHC: 34.2 g/dL (ref 31.4–35.0)
MCV: 93.8 FL (ref 82.0–102.0)
MPV: 9.7 FL (ref 9.4–12.3)
Monocytes %: 8 % (ref 4.0–12.0)
Monocytes Absolute: 1.4 10*3/uL — ABNORMAL HIGH (ref 0.1–1.3)
Neutrophils %: 48 % (ref 43–78)
Neutrophils Absolute: 8.2 10*3/uL (ref 1.7–8.2)
Platelets: 449 10*3/uL (ref 150–450)
RBC: 4.52 M/uL (ref 4.23–5.6)
RDW: 13.2 % (ref 11.9–14.6)
WBC: 17.2 10*3/uL — ABNORMAL HIGH (ref 4.3–11.1)
nRBC: 0 10*3/uL (ref 0.0–0.2)

## 2021-10-18 LAB — COMPREHENSIVE METABOLIC PANEL
ALT: 28 U/L (ref 12–65)
AST: 22 U/L (ref 15–37)
Albumin/Globulin Ratio: 0.8 (ref 0.4–1.6)
Albumin: 3.4 g/dL (ref 3.2–4.6)
Alk Phosphatase: 71 U/L (ref 50–136)
Anion Gap: 6 mmol/L (ref 2–11)
BUN: 25 MG/DL — ABNORMAL HIGH (ref 8–23)
CO2: 29 mmol/L (ref 21–32)
Calcium: 9.2 MG/DL (ref 8.3–10.4)
Chloride: 99 mmol/L — ABNORMAL LOW (ref 101–110)
Creatinine: 1.2 MG/DL (ref 0.8–1.5)
Est, Glom Filt Rate: >60 mL/min/{1.73_m2} (ref >60)
Globulin: 4.5 g/dL (ref 2.8–4.5)
Glucose: 134 mg/dL — ABNORMAL HIGH (ref 65–100)
Potassium: 3.2 mmol/L — ABNORMAL LOW (ref 3.5–5.1)
Sodium: 134 mmol/L (ref 133–143)
Total Bilirubin: 0.4 MG/DL (ref 0.2–1.1)
Total Protein: 7.9 g/dL (ref 6.3–8.2)

## 2021-10-18 LAB — MISCELLANEOUS SENDOUT
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
## 2021-10-18 NOTE — Patient Instructions (Addendum)
Patient Instructions from Today's Visit    Reason for Visit:  New patient visit for leukocytosis    Plan:  -Your white blood cells have remained elevated, your lymphocytes are elevated. Dr. Ronny Flurry recommends running more labs to rule put leukemia. We will call you with results.  -Follow up with PCP and come back as needed.     Follow Up:      Recent Lab Results:  Hospital Outpatient Visit on 10/18/2021   Component Date Value Ref Range Status    WBC 10/18/2021 17.2 (H)  4.3 - 11.1 K/uL Final    PERIPHERAL REVIEW TO FOLLOW    RBC 10/18/2021 4.52  4.23 - 5.6 M/uL Final    Hemoglobin 10/18/2021 14.5  13.6 - 17.2 g/dL Final    Hematocrit 10/18/2021 42.4  41.1 - 50.3 % Final    MCV 10/18/2021 93.8  82.0 - 102.0 FL Final    MCH 10/18/2021 32.1  26.1 - 32.9 PG Final    MCHC 10/18/2021 34.2  31.4 - 35.0 g/dL Final    RDW 10/18/2021 13.2  11.9 - 14.6 % Final    Platelets 10/18/2021 449  150 - 450 K/uL Final    MPV 10/18/2021 9.7  9.4 - 12.3 FL Final    nRBC 10/18/2021 0.00  0.0 - 0.2 K/uL Final    **Note: Absolute NRBC parameter is now reported with Hemogram**    Differential Type 10/18/2021 PENDING   Incomplete    Sodium 10/18/2021 134  133 - 143 mmol/L Final    Potassium 10/18/2021 3.2 (L)  3.5 - 5.1 mmol/L Final    Chloride 10/18/2021 99 (L)  101 - 110 mmol/L Final    CO2 10/18/2021 29  21 - 32 mmol/L Final    Anion Gap 10/18/2021 6  2 - 11 mmol/L Final    Glucose 10/18/2021 134 (H)  65 - 100 mg/dL Final    BUN 10/18/2021 25 (H)  8 - 23 MG/DL Final    Creatinine 10/18/2021 1.20  0.8 - 1.5 MG/DL Final    Est, Glom Filt Rate 10/18/2021 >60  >60 ml/min/1.74m Final    Comment:    Pediatric calculator link: https://www.kidney.org/professionals/kdoqi/gfr_calculatorped     These results are not intended for use in patients <143years of age.     eGFR results are calculated without a race factor using  the 2021 CKD-EPI equation. Careful clinical correlation is recommended, particularly when comparing to results calculated using  previous equations.  The CKD-EPI equation is less accurate in patients with extremes of muscle mass, extra-renal metabolism of creatinine, excessive creatine ingestion, or following therapy that affects renal tubular secretion.      Calcium 10/18/2021 9.2  8.3 - 10.4 MG/DL Final    Total Bilirubin 10/18/2021 0.4  0.2 - 1.1 MG/DL Final    ALT 10/18/2021 28  12 - 65 U/L Final    AST 10/18/2021 22  15 - 37 U/L Final    Alk Phosphatase 10/18/2021 71  50 - 136 U/L Final    Total Protein 10/18/2021 7.9  6.3 - 8.2 g/dL Final    Albumin 10/18/2021 3.4  3.2 - 4.6 g/dL Final    Globulin 10/18/2021 4.5  2.8 - 4.5 g/dL Final    Albumin/Globulin Ratio 10/18/2021 0.8  0.4 - 1.6   Final         Treatment Summary has been discussed and given to patient: n/a        -------------------------------------------------------------------------------------------------------------------  Please call our office at (  [phone] if you have any  of the following symptoms:   Fever of 100.5 or greater  Chills  Shortness of breath  Swelling or pain in one leg    After office hours an answering service is available and will contact a provider for emergencies or if you are experiencing any of the above symptoms.    Patient does express an interest in My Chart.  My Chart log in information explained on the after visit summary printout at the [facility] desk.    Iva Boop RN

## 2021-10-19 LAB — PERIPHERAL BLOOD SMEAR, PATH REVIEW

## 2021-10-24 LAB — FLOW CYTOMETRY LEUKEMIA/LYMPHOMA BLOOD

## 2021-10-27 NOTE — Telephone Encounter (Signed)
Called patient to notify lab work came back within normal range and no need for further workup at this time. Patient to continue to follow up with PCP and come back as needed. LVM.

## 2021-11-06 MED ORDER — GABAPENTIN 300 MG PO CAPS
300 MG | ORAL_CAPSULE | ORAL | 5 refills | Status: AC
Start: 2021-11-06 — End: 2022-11-07

## 2021-11-21 MED ORDER — CYCLOBENZAPRINE HCL 10 MG PO TABS
10 MG | ORAL_TABLET | ORAL | 1 refills | Status: AC
Start: 2021-11-21 — End: ?

## 2021-11-22 ENCOUNTER — Other Ambulatory Visit: Payer: Self-pay | Admitting: Family Medicine

## 2021-11-22 NOTE — Telephone Encounter (Signed)
Called pt and spoke to pt's wife. Pt lives in [place] since June 2022. Refusing prescription.   Requested Prescriptions  Pending Prescriptions Disp Refills   BREO ELLIPTA 100-25 MCG/ACT AEPB [Pharmacy Med Name: BREO ELLIPTA 100-25MCG ORAL INH(30)] 60 each 1    Sig: INHALE 1 PUFF INTO THE LUNGS DAILY     Pulmonology:  Combination Products Failed - 11/22/2021  4:07 AM      Failed - Valid encounter within last 12 months    Recent Outpatient Visits           1 year ago Leukocytosis, unspecified type   [facility] [place], Wendee Beavers, PA-C   1 year ago Annual physical exam   [facility] Collet M, [place]   2 years ago Type 2 diabetes mellitus with microalbuminuria, with long-term current use of insulin [facility])   [facility] [place], [place], [place]   2 years ago Type 2 diabetes mellitus with microalbuminuria, with long-term current use of insulin [facility])   [facility] [place], [place], [place]   2 years ago Type 2 diabetes mellitus with microalbuminuria, with long-term current use of insulin [facility])   [place], [place], [place]

## 2022-02-27 ENCOUNTER — Encounter: Payer: MEDICARE | Attending: Family Medicine

## 2022-03-22 ENCOUNTER — Encounter: Payer: MEDICARE | Attending: Family Medicine

## 2023-01-02 ENCOUNTER — Encounter: Payer: Self-pay | Admitting: Psychiatry
